# Patient Record
Sex: Female | Born: 1955 | Race: White | Hispanic: No | Marital: Married | Smoking: Never smoker
Health system: Southern US, Community
[De-identification: ages and names within clinical notes are randomized; demographics above are authoritative.]

## PROBLEM LIST (undated history)

## (undated) DIAGNOSIS — R112 Nausea with vomiting, unspecified: Secondary | ICD-10-CM

## (undated) DIAGNOSIS — D649 Anemia, unspecified: Secondary | ICD-10-CM

## (undated) DIAGNOSIS — N200 Calculus of kidney: Secondary | ICD-10-CM

## (undated) DIAGNOSIS — Z9889 Other specified postprocedural states: Secondary | ICD-10-CM

## (undated) DIAGNOSIS — C801 Malignant (primary) neoplasm, unspecified: Secondary | ICD-10-CM

## (undated) DIAGNOSIS — I10 Essential (primary) hypertension: Secondary | ICD-10-CM

## (undated) DIAGNOSIS — E785 Hyperlipidemia, unspecified: Secondary | ICD-10-CM

## (undated) DIAGNOSIS — E119 Type 2 diabetes mellitus without complications: Secondary | ICD-10-CM

## (undated) DIAGNOSIS — K635 Polyp of colon: Secondary | ICD-10-CM

## (undated) HISTORY — DX: Polyp of colon: K63.5

## (undated) HISTORY — PX: MOUTH SURGERY: SHX715

## (undated) HISTORY — PX: DILATION AND CURETTAGE OF UTERUS: SHX78

## (undated) HISTORY — DX: Calculus of kidney: N20.0

## (undated) HISTORY — DX: Essential (primary) hypertension: I10

## (undated) HISTORY — DX: Type 2 diabetes mellitus without complications: E11.9

## (undated) HISTORY — DX: Hyperlipidemia, unspecified: E78.5

## (undated) HISTORY — PX: FOOT SURGERY: SHX648

## (undated) HISTORY — PX: COLONOSCOPY: SHX174

---

## 2004-10-08 DIAGNOSIS — I10 Essential (primary) hypertension: Secondary | ICD-10-CM

## 2004-10-08 DIAGNOSIS — E119 Type 2 diabetes mellitus without complications: Secondary | ICD-10-CM

## 2004-10-08 HISTORY — DX: Type 2 diabetes mellitus without complications: E11.9

## 2004-10-08 HISTORY — DX: Essential (primary) hypertension: I10

## 2006-10-08 DIAGNOSIS — C801 Malignant (primary) neoplasm, unspecified: Secondary | ICD-10-CM

## 2006-10-08 HISTORY — DX: Malignant (primary) neoplasm, unspecified: C80.1

## 2006-10-08 HISTORY — PX: ABDOMINAL HYSTERECTOMY: SHX81

## 2007-06-06 ENCOUNTER — Emergency Department: Payer: Self-pay | Admitting: Emergency Medicine

## 2007-06-12 ENCOUNTER — Ambulatory Visit: Payer: Self-pay | Admitting: Unknown Physician Specialty

## 2007-06-12 ENCOUNTER — Other Ambulatory Visit: Payer: Self-pay

## 2007-06-17 ENCOUNTER — Ambulatory Visit: Payer: Self-pay | Admitting: Gynecologic Oncology

## 2007-07-15 ENCOUNTER — Ambulatory Visit: Payer: Self-pay | Admitting: Gynecologic Oncology

## 2007-08-12 ENCOUNTER — Ambulatory Visit: Payer: Self-pay | Admitting: Gynecologic Oncology

## 2007-09-16 ENCOUNTER — Ambulatory Visit: Payer: Self-pay | Admitting: Family Medicine

## 2008-04-02 ENCOUNTER — Ambulatory Visit: Payer: Self-pay | Admitting: Family Medicine

## 2008-10-08 DIAGNOSIS — K635 Polyp of colon: Secondary | ICD-10-CM

## 2008-10-08 HISTORY — DX: Polyp of colon: K63.5

## 2008-11-25 ENCOUNTER — Ambulatory Visit: Payer: Self-pay | Admitting: Gastroenterology

## 2009-04-28 ENCOUNTER — Ambulatory Visit: Payer: Self-pay | Admitting: Family Medicine

## 2010-05-08 ENCOUNTER — Ambulatory Visit: Payer: Self-pay | Admitting: Family Medicine

## 2011-05-30 ENCOUNTER — Ambulatory Visit: Payer: Self-pay | Admitting: Family Medicine

## 2012-05-30 ENCOUNTER — Ambulatory Visit: Payer: Self-pay | Admitting: Family Medicine

## 2013-06-03 ENCOUNTER — Ambulatory Visit: Payer: Self-pay | Admitting: Family Medicine

## 2014-06-04 ENCOUNTER — Ambulatory Visit: Payer: Self-pay | Admitting: Family Medicine

## 2014-06-10 ENCOUNTER — Ambulatory Visit: Payer: Self-pay | Admitting: Family Medicine

## 2014-06-15 ENCOUNTER — Ambulatory Visit: Payer: Self-pay | Admitting: Family Medicine

## 2014-06-15 HISTORY — PX: BREAST BIOPSY: SHX20

## 2014-06-16 LAB — PATHOLOGY REPORT

## 2015-06-16 ENCOUNTER — Other Ambulatory Visit: Payer: Self-pay | Admitting: Nurse Practitioner

## 2015-06-16 DIAGNOSIS — Z1231 Encounter for screening mammogram for malignant neoplasm of breast: Secondary | ICD-10-CM

## 2015-06-22 ENCOUNTER — Ambulatory Visit
Admission: RE | Admit: 2015-06-22 | Discharge: 2015-06-22 | Disposition: A | Discharge status: Home / Self Care | Payer: No Typology Code available for payment source | Source: Ambulatory Visit | Attending: Nurse Practitioner | Admitting: Nurse Practitioner

## 2015-06-22 DIAGNOSIS — R922 Inconclusive mammogram: Secondary | ICD-10-CM | POA: Insufficient documentation

## 2015-06-22 DIAGNOSIS — Z1231 Encounter for screening mammogram for malignant neoplasm of breast: Secondary | ICD-10-CM | POA: Diagnosis present

## 2015-06-22 HISTORY — DX: Malignant (primary) neoplasm, unspecified: C80.1

## 2015-06-27 ENCOUNTER — Other Ambulatory Visit: Payer: Self-pay | Admitting: Nurse Practitioner

## 2015-06-27 DIAGNOSIS — R928 Other abnormal and inconclusive findings on diagnostic imaging of breast: Secondary | ICD-10-CM

## 2015-06-30 ENCOUNTER — Ambulatory Visit
Admission: RE | Admit: 2015-06-30 | Discharge: 2015-06-30 | Disposition: A | Discharge status: Home / Self Care | Payer: No Typology Code available for payment source | Source: Ambulatory Visit | Attending: Nurse Practitioner | Admitting: Nurse Practitioner

## 2015-06-30 DIAGNOSIS — R928 Other abnormal and inconclusive findings on diagnostic imaging of breast: Secondary | ICD-10-CM

## 2015-06-30 DIAGNOSIS — N63 Unspecified lump in breast: Secondary | ICD-10-CM | POA: Insufficient documentation

## 2015-07-01 ENCOUNTER — Other Ambulatory Visit: Payer: Self-pay | Admitting: Nurse Practitioner

## 2015-07-01 DIAGNOSIS — N63 Unspecified lump in unspecified breast: Secondary | ICD-10-CM

## 2015-07-05 ENCOUNTER — Ambulatory Visit
Admission: RE | Admit: 2015-07-05 | Discharge: 2015-07-05 | Disposition: A | Discharge status: Home / Self Care | Payer: No Typology Code available for payment source | Source: Ambulatory Visit | Attending: Nurse Practitioner | Admitting: Nurse Practitioner

## 2015-07-05 DIAGNOSIS — N63 Unspecified lump in unspecified breast: Secondary | ICD-10-CM

## 2015-07-05 HISTORY — PX: BREAST BIOPSY: SHX20

## 2015-07-06 LAB — SURGICAL PATHOLOGY

## 2015-07-08 ENCOUNTER — Encounter: Payer: Self-pay | Admitting: *Deleted

## 2015-07-14 ENCOUNTER — Ambulatory Visit (INDEPENDENT_AMBULATORY_CARE_PROVIDER_SITE_OTHER): Payer: No Typology Code available for payment source | Admitting: General Surgery

## 2015-07-14 ENCOUNTER — Other Ambulatory Visit: Payer: Self-pay | Admitting: General Surgery

## 2015-07-14 ENCOUNTER — Ambulatory Visit: Payer: No Typology Code available for payment source

## 2015-07-14 ENCOUNTER — Encounter: Payer: Self-pay | Admitting: General Surgery

## 2015-07-14 VITALS — BP 142/80 | HR 64 | Resp 14 | Ht 67.0 in | Wt 292.0 lb

## 2015-07-14 DIAGNOSIS — N6310 Unspecified lump in the right breast, unspecified quadrant: Secondary | ICD-10-CM

## 2015-07-14 DIAGNOSIS — N62 Hypertrophy of breast: Secondary | ICD-10-CM | POA: Diagnosis not present

## 2015-07-14 DIAGNOSIS — N63 Unspecified lump in breast: Secondary | ICD-10-CM

## 2015-07-14 DIAGNOSIS — N6091 Unspecified benign mammary dysplasia of right breast: Secondary | ICD-10-CM

## 2015-07-14 NOTE — Progress Notes (Signed)
Patient ID: Meagan Diaz, female   DOB: 1956/09/25, 59 y.o.   MRN: [id number]  Chief Complaint  Patient presents with  . Breast Problem    right breast lump    HPI Meagan Diaz is a 59 y.o. female.  who presents for a breast evaluation. The most recent mammogram was done on 06-22-15. 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 Patient does perform regular self breast checks and gets regular mammograms done.   Denies any family history of breast or colon cancer.  HPI  Past Medical History  Diagnosis Date  . 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 Hyperlipidemia   . Kidney stone   . Colon polyp 2010  . Diabetes mellitus without complication ([place]) 1103  . Hypertension 2006    Past Surgical History  Procedure Laterality Date  . Foot surgery Bilateral   . Colonoscopy  2010, 2013  . Breast biopsy Left 06/15/14    [place] bx /clip-neg/cyst  . Breast biopsy Right 07-05-15    8:30 location/atypical ductal hyperplasia  . Breast biopsy Right 07-05-15    fibroadenoma  . Abdominal hysterectomy  2008    [facility]    No family history on file.  Social History Social History  Substance Use Topics  . Smoking status: Never Smoker   . Smokeless tobacco: Never Used  . Alcohol Use: No    Allergies  Allergen Reactions  . Clindamycin/Lincomycin Rash  . Penicillins Rash  . Sulfa Antibiotics Rash    Current Outpatient Prescriptions  Medication Sig Dispense Refill  . aspirin 81 MG tablet Take 81 mg by mouth daily.    Marland Kitchen atenolol (TENORMIN) 50 MG tablet Take 50 mg by mouth daily.    Marland Kitchen atorvastatin (LIPITOR) 80 MG tablet Take 80 mg by mouth daily.    . Cholecalciferol (VITAMIN D3) 1000 UNITS CAPS Take by mouth 2 (two) times daily.    . enalapril (VASOTEC) 20 MG tablet Take 20 mg by mouth daily.    Marland Kitchen glipiZIDE (GLUCOTROL XL) 2.5 MG 24 hr tablet Take 2.5 mg by mouth daily with breakfast.    . hydrochlorothiazide (HYDRODIURIL) 12.5 MG tablet Take 12.5 mg by  mouth daily.    . metFORMIN (GLUCOPHAGE) 500 MG tablet Take 500 mg by mouth 2 (two) times daily with a meal.     No current facility-administered medications for this visit.    Review of Systems Review of Systems  Constitutional: Negative.   Respiratory: Negative.   Cardiovascular: Negative.     Blood pressure 142/80, pulse 64, resp. rate 14, height 5\' 7"  (1.702 m), weight 292 lb (132.45 kg).  Physical Exam Physical Exam  Constitutional: She is oriented to person, place, and time. She appears well-developed and well-nourished.  HENT:  Mouth/Throat: Oropharynx is clear and moist.  Eyes: Conjunctivae are normal. No scleral icterus.  Neck: Neck supple.  Cardiovascular: Normal rate, regular rhythm and normal heart sounds.   Pulmonary/Chest: Effort normal and breath sounds normal. Right breast exhibits no inverted nipple, no mass, no nipple discharge, no skin change and no tenderness. Left breast exhibits no inverted nipple, no mass, no nipple discharge, no skin change and no tenderness.  Lymphadenopathy:    She has no cervical adenopathy.    She has no axillary adenopathy.  Neurological: She is alert and oriented to person, place, and time.  Skin: Skin is warm and dry.  Psychiatric: Her behavior is normal.    Data Reviewed  September 2016 mammogram and ultrasound reports reviewed.  Pathology from biopsy completed on 07/05/2015 of the right breast showed a fibroadenoma with focal ductal hyperplasia at the 9:00 position as well as atypical ductal hyperplasia with apical and cysts and dilated ducts with surrounding sclerosis at the 8:30 o'clock position. The area of atypical hyperplasia was 0.5 mm in diameter.  Postbiopsy images were reviewed.  Limited right breast ultrasound was undertaken to determine if the biopsy sites were clearly identified. At the 9:00 position a multilobulated 0.4 x 0.6 x 0.8 cm area consistent with the biopsy site is noted 9 cm from the nipple.  At the 8:30  o'clock position a vaguely defined less than 0.6 cm area with posterior acoustic shadowing was noted. The biopsy clip was not clearly identified.  Assessment    Microscopic foci of ADH for which reexcision is indicated to confirm the areas not upstaged to DCIS.    Plan    The visit got off to somewhat of a stormy start as the patient was dismayed that I had not reviewed her images prior to coming in to meet her. I explained to her the importance of meeting the person first and the images are not reviewed until the patient presents for the appointment.   The indication for formal excision of the area was reviewed. There is approximately 15% chance of upstaging to DCIS.  Patient numerous questions which were addressed. Indications for chemoprevention will be deferred pending review of the final resected specimen.      Patient's surgery has been scheduled for 07-21-15 at [facility]. It is okay for patient to continue 81 mg aspirin once daily.   PCP:  Altamease Oiler 07/14/2015, 5:13 PM

## 2015-07-14 NOTE — Patient Instructions (Addendum)
The patient is aware to call back for any questions or concerns.  Schedule surgery right breast.   Patient's surgery has been scheduled for 07-21-15 at [facility]. It is okay for patient to continue 81 mg aspirin once daily.

## 2015-07-14 NOTE — H&P (Signed)
HPI  Meagan Diaz is a 59 y.o. female. who presents for a breast evaluation. The most recent mammogram was done on 06-22-15. 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  Patient does perform regular self breast checks and gets regular mammograms done.  Denies any family history of breast or colon cancer.  HPI  Past Medical History   Diagnosis  Date   .  Cancer [facility])  2008     uterine ca   .  Hyperlipidemia    .  Kidney stone    .  Colon polyp  2010   .  Diabetes mellitus without complication ([place])  3825   .  Hypertension  2006    Past Surgical History   Procedure  Laterality  Date   .  Foot surgery  Bilateral    .  Colonoscopy   2010, 2013   .  Breast biopsy  Left  06/15/14     [place] bx /clip-neg/cyst   .  Breast biopsy  Right  07-05-15     8:30 location/atypical ductal hyperplasia   .  Breast biopsy  Right  07-05-15     fibroadenoma   .  Abdominal hysterectomy   2008     [facility]    No family history on file.  Social History  Social History   Substance Use Topics   .  Smoking status:  Never Smoker   .  Smokeless tobacco:  Never Used   .  Alcohol Use:  No    Allergies   Allergen  Reactions   .  Clindamycin/Lincomycin  Rash   .  Penicillins  Rash   .  Sulfa Antibiotics  Rash    Current Outpatient Prescriptions   Medication  Sig  Dispense  Refill   .  aspirin 81 MG tablet  Take 81 mg by mouth daily.     Marland Kitchen  atenolol (TENORMIN) 50 MG tablet  Take 50 mg by mouth daily.     Marland Kitchen  atorvastatin (LIPITOR) 80 MG tablet  Take 80 mg by mouth daily.     .  Cholecalciferol (VITAMIN D3) 1000 UNITS CAPS  Take by mouth 2 (two) times daily.     .  enalapril (VASOTEC) 20 MG tablet  Take 20 mg by mouth daily.     Marland Kitchen  glipiZIDE (GLUCOTROL XL) 2.5 MG 24 hr tablet  Take 2.5 mg by mouth daily with breakfast.     .  hydrochlorothiazide (HYDRODIURIL) 12.5 MG tablet  Take 12.5 mg by mouth daily.     .  metFORMIN (GLUCOPHAGE) 500 MG tablet  Take 500 mg by mouth  2 (two) times daily with a meal.      No current facility-administered medications for this visit.    Review of Systems  Review of Systems  Constitutional: Negative.  Respiratory: Negative.  Cardiovascular: Negative.   Blood pressure 142/80, pulse 64, resp. rate 14, height 5\' 7"  (1.702 m), weight 292 lb (132.45 kg).  Physical Exam  Physical Exam  Constitutional: She is oriented to person, place, and time. She appears well-developed and well-nourished.  HENT:  Mouth/Throat: Oropharynx is clear and moist.  Eyes: Conjunctivae are normal. No scleral icterus.  Neck: Neck supple.  Cardiovascular: Normal rate, regular rhythm and normal heart sounds.  Pulmonary/Chest: Effort normal and breath sounds normal. Right breast exhibits no inverted nipple, no mass, no nipple discharge, no skin change and no tenderness. Left breast exhibits no inverted nipple, no  mass, no nipple discharge, no skin change and no tenderness.  Lymphadenopathy:  She has no cervical adenopathy.  She has no axillary adenopathy.  Neurological: She is alert and oriented to person, place, and time.  Skin: Skin is warm and dry.  Psychiatric: Her behavior is normal.   Data Reviewed  September 2016 mammogram and ultrasound reports reviewed.  Pathology from biopsy completed on 07/05/2015 of the right breast showed a fibroadenoma with focal ductal hyperplasia at the 9:00 position as well as atypical ductal hyperplasia with apical and cysts and dilated ducts with surrounding sclerosis at the 8:30 o'clock position. The area of atypical hyperplasia was 0.5 mm in diameter.  Postbiopsy images were reviewed.  Limited right breast ultrasound was undertaken to determine if the biopsy sites were clearly identified. At the 9:00 position a multilobulated 0.4 x 0.6 x 0.8 cm area consistent with the biopsy site is noted 9 cm from the nipple.  At the 8:30 o'clock position a vaguely defined less than 0.6 cm area with posterior acoustic shadowing  was noted. The biopsy clip was not clearly identified.  Assessment   Microscopic foci of ADH for which reexcision is indicated to confirm the areas not upstaged to DCIS.  Plan   The visit got off to somewhat of a stormy start as the patient was dismayed that I had not reviewed her images prior to coming in to meet her. I explained to her the importance of meeting the person first and the images are not reviewed until the patient presents for the appointment.  The indication for formal excision of the area was reviewed. There is approximately 15% chance of upstaging to DCIS.  Patient numerous questions which were addressed. Indications for chemoprevention will be deferred pending review of the final resected specimen.   Patient's surgery has been scheduled for 07-21-15 at [facility]. It is okay for patient to continue 81 mg aspirin once daily.  PCP: Altamease Oiler  07/14/2015, 5:13 PM

## 2015-07-15 ENCOUNTER — Inpatient Hospital Stay: Admission: RE | Admit: 2015-07-15 | Payer: No Typology Code available for payment source | Source: Ambulatory Visit

## 2015-07-15 ENCOUNTER — Encounter: Payer: Self-pay | Admitting: *Deleted

## 2015-07-15 NOTE — Patient Instructions (Addendum)
  Your procedure is scheduled on: 07-21-15 Report to [facility] @ 9:20 PER PT   Remember: Instructions that are not followed completely may result in serious medical risk, up to and including death, or upon the discretion of your surgeon and anesthesiologist your surgery may need to be rescheduled.    __X_ 1. Do not eat food or drink liquids after midnight. No gum chewing or hard candies.     __X_ 2. No Alcohol for 24 hours before or after surgery.   ____ 3. Bring all medications with you on the day of surgery if instructed.    ____ 4. Notify your doctor if there is any change in your medical condition     (cold, fever, infections).     Do not wear jewelry, make-up, hairpins, clips or nail polish.  Do not wear lotions, powders, or perfumes. You may wear deodorant.  Do not shave 48 hours prior to surgery. Men may shave face and neck.  Do not bring valuables to the hospital.    [facility] is not responsible for any belongings or valuables.               Contacts, dentures or bridgework may not be worn into surgery.  Leave your suitcase in the car. After surgery it may be brought to your room.  For patients admitted to the hospital, discharge time is determined by your treatment team.   Patients discharged the day of surgery will not be allowed to drive home.   Please read over the following fact sheets that you were given:      __X_ Take these medicines the morning of surgery with A SIP OF WATER:    1. ATENOLOL  2. ENALAPRIL  3.   4.  5.  6.  ____ Fleet Enema (as directed)   ____ Use CHG Soap as directed  ____ Use inhalers on the day of surgery  __X__ Stop metformin 2 days prior to surgery-LAST DOSE ON Monday 07-18-15    ____ Take 1/2 of usual insulin dose the night before surgery and none on the morning of surgery.   ____ Stop Coumadin/Plavix/aspirin-MAY CONTINUE 81 MG ASA PER DR BYRNETTS H&P-DO NOT TAKE AM OF SURGERY  ____ Stop  Anti-inflammatories   ____ Stop supplements until after surgery.    ____ Bring C-Pap to the hospital.

## 2015-07-18 ENCOUNTER — Ambulatory Visit
Admission: RE | Admit: 2015-07-18 | Discharge: 2015-07-18 | Disposition: A | Discharge status: Home / Self Care | Payer: No Typology Code available for payment source | Source: Ambulatory Visit | Attending: General Surgery | Admitting: General Surgery

## 2015-07-18 ENCOUNTER — Other Ambulatory Visit
Admission: RE | Admit: 2015-07-18 | Discharge: 2015-07-18 | Disposition: A | Discharge status: Home / Self Care | Payer: No Typology Code available for payment source | Source: Ambulatory Visit | Attending: General Surgery | Admitting: General Surgery

## 2015-07-18 DIAGNOSIS — I10 Essential (primary) hypertension: Secondary | ICD-10-CM | POA: Diagnosis present

## 2015-07-18 DIAGNOSIS — Z01812 Encounter for preprocedural laboratory examination: Secondary | ICD-10-CM | POA: Insufficient documentation

## 2015-07-18 DIAGNOSIS — Z01810 Encounter for preprocedural cardiovascular examination: Secondary | ICD-10-CM | POA: Diagnosis present

## 2015-07-18 LAB — POTASSIUM: Potassium: 4.1 mmol/L (ref 3.5–5.1)

## 2015-07-21 ENCOUNTER — Ambulatory Visit
Admission: RE | Admit: 2015-07-21 | Discharge: 2015-07-21 | Disposition: A | Discharge status: Home / Self Care | Payer: No Typology Code available for payment source | Source: Ambulatory Visit | Attending: General Surgery | Admitting: General Surgery

## 2015-07-21 ENCOUNTER — Encounter
Admission: RE | Disposition: A | Discharge status: Home / Self Care | Payer: Self-pay | Source: Ambulatory Visit | Attending: General Surgery

## 2015-07-21 ENCOUNTER — Encounter: Payer: Self-pay | Admitting: *Deleted

## 2015-07-21 ENCOUNTER — Ambulatory Visit: Payer: No Typology Code available for payment source | Admitting: Registered Nurse

## 2015-07-21 ENCOUNTER — Ambulatory Visit: Payer: No Typology Code available for payment source

## 2015-07-21 DIAGNOSIS — N6310 Unspecified lump in the right breast, unspecified quadrant: Secondary | ICD-10-CM

## 2015-07-21 DIAGNOSIS — Z880 Allergy status to penicillin: Secondary | ICD-10-CM | POA: Diagnosis not present

## 2015-07-21 DIAGNOSIS — Z87442 Personal history of urinary calculi: Secondary | ICD-10-CM | POA: Insufficient documentation

## 2015-07-21 DIAGNOSIS — Z882 Allergy status to sulfonamides status: Secondary | ICD-10-CM | POA: Diagnosis not present

## 2015-07-21 DIAGNOSIS — N6091 Unspecified benign mammary dysplasia of right breast: Secondary | ICD-10-CM | POA: Insufficient documentation

## 2015-07-21 DIAGNOSIS — Z881 Allergy status to other antibiotic agents status: Secondary | ICD-10-CM | POA: Diagnosis not present

## 2015-07-21 DIAGNOSIS — D649 Anemia, unspecified: Secondary | ICD-10-CM | POA: Diagnosis not present

## 2015-07-21 DIAGNOSIS — Z79899 Other long term (current) drug therapy: Secondary | ICD-10-CM | POA: Diagnosis not present

## 2015-07-21 DIAGNOSIS — E559 Vitamin D deficiency, unspecified: Secondary | ICD-10-CM | POA: Diagnosis not present

## 2015-07-21 DIAGNOSIS — E785 Hyperlipidemia, unspecified: Secondary | ICD-10-CM | POA: Insufficient documentation

## 2015-07-21 DIAGNOSIS — Z8249 Family history of ischemic heart disease and other diseases of the circulatory system: Secondary | ICD-10-CM | POA: Diagnosis not present

## 2015-07-21 DIAGNOSIS — E119 Type 2 diabetes mellitus without complications: Secondary | ICD-10-CM | POA: Diagnosis not present

## 2015-07-21 DIAGNOSIS — Z6841 Body Mass Index (BMI) 40.0 and over, adult: Secondary | ICD-10-CM | POA: Diagnosis not present

## 2015-07-21 DIAGNOSIS — Z7984 Long term (current) use of oral hypoglycemic drugs: Secondary | ICD-10-CM | POA: Diagnosis not present

## 2015-07-21 DIAGNOSIS — D4861 Neoplasm of uncertain behavior of right breast: Secondary | ICD-10-CM | POA: Diagnosis not present

## 2015-07-21 DIAGNOSIS — R928 Other abnormal and inconclusive findings on diagnostic imaging of breast: Secondary | ICD-10-CM | POA: Diagnosis present

## 2015-07-21 DIAGNOSIS — I10 Essential (primary) hypertension: Secondary | ICD-10-CM | POA: Diagnosis not present

## 2015-07-21 HISTORY — DX: Nausea with vomiting, unspecified: R11.2

## 2015-07-21 HISTORY — DX: Other specified postprocedural states: Z98.890

## 2015-07-21 HISTORY — PX: BREAST BIOPSY: SHX20

## 2015-07-21 HISTORY — DX: Anemia, unspecified: D64.9

## 2015-07-21 LAB — GLUCOSE, CAPILLARY: Glucose-Capillary: 126 mg/dL — ABNORMAL HIGH (ref 65–99)

## 2015-07-21 SURGERY — BREAST BIOPSY WITH NEEDLE LOCALIZATION
Anesthesia: Monitor Anesthesia Care | Laterality: Right | Wound class: Clean

## 2015-07-21 MED ORDER — FENTANYL CITRATE (PF) 100 MCG/2ML IJ SOLN: 25.0000 ug | INTRAMUSCULAR | Status: DC | PRN

## 2015-07-21 MED ORDER — MIDAZOLAM HCL 2 MG/2ML IJ SOLN
INTRAMUSCULAR | Status: DC | PRN
  Administered 2015-07-21: 2 mg via INTRAVENOUS

## 2015-07-21 MED ORDER — ACETAMINOPHEN 10 MG/ML IV SOLN
INTRAVENOUS | Status: DC | PRN
  Administered 2015-07-21: 1000 mg via INTRAVENOUS

## 2015-07-21 MED ORDER — SODIUM CHLORIDE 0.9 % IV SOLN
INTRAVENOUS | Status: DC
  Administered 2015-07-21: 11:00:00 via INTRAVENOUS

## 2015-07-21 MED ORDER — HYDROCODONE-ACETAMINOPHEN 5-325 MG PO TABS: 1.0000 | ORAL_TABLET | ORAL | Status: DC | PRN

## 2015-07-21 MED ORDER — BUPIVACAINE-EPINEPHRINE (PF) 0.5% -1:200000 IJ SOLN
INTRAMUSCULAR | Status: AC
  Filled 2015-07-21: qty 30

## 2015-07-21 MED ORDER — FAMOTIDINE 20 MG PO TABS
ORAL_TABLET | ORAL | Status: AC
  Administered 2015-07-21: 20 mg via ORAL
  Filled 2015-07-21: qty 1

## 2015-07-21 MED ORDER — ONDANSETRON HCL 4 MG/2ML IJ SOLN: 4.0000 mg | Freq: Once | INTRAMUSCULAR | Status: DC | PRN

## 2015-07-21 MED ORDER — EPHEDRINE SULFATE 50 MG/ML IJ SOLN
INTRAMUSCULAR | Status: DC | PRN
  Administered 2015-07-21 (×5): 10 mg via INTRAVENOUS

## 2015-07-21 MED ORDER — BUPIVACAINE-EPINEPHRINE (PF) 0.5% -1:200000 IJ SOLN
INTRAMUSCULAR | Status: DC | PRN
  Administered 2015-07-21: 30 mL

## 2015-07-21 MED ORDER — KETOROLAC TROMETHAMINE 30 MG/ML IJ SOLN
INTRAMUSCULAR | Status: DC | PRN
  Administered 2015-07-21: 30 mg via INTRAVENOUS

## 2015-07-21 MED ORDER — ONDANSETRON HCL 4 MG/2ML IJ SOLN
INTRAMUSCULAR | Status: DC | PRN
  Administered 2015-07-21: 4 mg via INTRAVENOUS

## 2015-07-21 MED ORDER — LIDOCAINE HCL 2 % EX GEL
CUTANEOUS | Status: DC | PRN
  Administered 2015-07-21: 1 via TOPICAL

## 2015-07-21 MED ORDER — PROPOFOL 10 MG/ML IV BOLUS
INTRAVENOUS | Status: DC | PRN
  Administered 2015-07-21: 180 mg via INTRAVENOUS

## 2015-07-21 MED ORDER — LIDOCAINE HCL (CARDIAC) 20 MG/ML IV SOLN
INTRAVENOUS | Status: DC | PRN
  Administered 2015-07-21: 100 mg via INTRAVENOUS

## 2015-07-21 MED ORDER — FAMOTIDINE 20 MG PO TABS
20.0000 mg | ORAL_TABLET | Freq: Once | ORAL | Status: AC
  Administered 2015-07-21: 20 mg via ORAL

## 2015-07-21 MED ORDER — ACETAMINOPHEN 10 MG/ML IV SOLN
INTRAVENOUS | Status: AC
  Filled 2015-07-21: qty 100

## 2015-07-21 MED ORDER — FENTANYL CITRATE (PF) 100 MCG/2ML IJ SOLN
INTRAMUSCULAR | Status: DC | PRN
  Administered 2015-07-21: 50 ug via INTRAVENOUS

## 2015-07-21 MED ORDER — DEXAMETHASONE SODIUM PHOSPHATE 4 MG/ML IJ SOLN
INTRAMUSCULAR | Status: DC | PRN
  Administered 2015-07-21: 10 mg via INTRAVENOUS

## 2015-07-21 MED ORDER — GLYCOPYRROLATE 0.2 MG/ML IJ SOLN
INTRAMUSCULAR | Status: DC | PRN
  Administered 2015-07-21: 0.2 mg via INTRAVENOUS

## 2015-07-21 SURGICAL SUPPLY — 37 items
BANDAGE ELASTIC 6 CLIP NS LF (GAUZE/BANDAGES/DRESSINGS) ×3 IMPLANT
BLADE SURG 15 STRL SS SAFETY (BLADE) ×6 IMPLANT
BNDG GAUZE 4.5X4.1 6PLY STRL (MISCELLANEOUS) ×3 IMPLANT
CANISTER SUCT 1200ML W/VALVE (MISCELLANEOUS) ×3 IMPLANT
CHLORAPREP W/TINT 26ML (MISCELLANEOUS) ×3 IMPLANT
CLOSURE WOUND 1/2 X4 (GAUZE/BANDAGES/DRESSINGS) ×1
CNTNR SPEC 2.5X3XGRAD LEK (MISCELLANEOUS) ×1
CONT SPEC 4OZ STER OR WHT (MISCELLANEOUS) ×2
CONTAINER SPEC 2.5X3XGRAD LEK (MISCELLANEOUS) ×1 IMPLANT
COVER PROBE FLX POLY STRL (MISCELLANEOUS) ×3 IMPLANT
DEVICE DUBIN SPECIMEN MAMMOGRA (MISCELLANEOUS) ×3 IMPLANT
DRAPE LAPAROTOMY 100X77 ABD (DRAPES) ×3 IMPLANT
DRESSING TELFA 4X3 1S ST N-ADH (GAUZE/BANDAGES/DRESSINGS) ×3 IMPLANT
ELECT CAUTERY BLADE TIP 2.5 (TIP) ×3
ELECTRODE CAUTERY BLDE TIP 2.5 (TIP) ×1 IMPLANT
GAUZE FLUFF 18X24 1PLY STRL (GAUZE/BANDAGES/DRESSINGS) ×3 IMPLANT
GLOVE BIO SURGEON STRL SZ7.5 (GLOVE) ×3 IMPLANT
GLOVE INDICATOR 8.0 STRL GRN (GLOVE) ×3 IMPLANT
GOWN STRL REUS W/ TWL LRG LVL3 (GOWN DISPOSABLE) ×2 IMPLANT
GOWN STRL REUS W/TWL LRG LVL3 (GOWN DISPOSABLE) ×4
KIT RM TURNOVER STRD PROC AR (KITS) ×3 IMPLANT
LABEL OR SOLS (LABEL) ×3 IMPLANT
MARGIN MAP 10MM (MISCELLANEOUS) ×3 IMPLANT
NDL SAFETY 22GX1.5 (NEEDLE) ×3 IMPLANT
NEEDLE HYPO 25X1 1.5 SAFETY (NEEDLE) ×3 IMPLANT
PACK BASIN MINOR ARMC (MISCELLANEOUS) ×3 IMPLANT
PAD GROUND ADULT SPLIT (MISCELLANEOUS) ×3 IMPLANT
STRIP CLOSURE SKIN 1/2X4 (GAUZE/BANDAGES/DRESSINGS) ×2 IMPLANT
SUT ETHILON 3-0 FS-10 30 BLK (SUTURE) ×3
SUT VIC AB 2-0 CT1 27 (SUTURE) ×2
SUT VIC AB 2-0 CT1 TAPERPNT 27 (SUTURE) ×1 IMPLANT
SUT VIC AB 4-0 FS2 27 (SUTURE) ×3 IMPLANT
SUTURE EHLN 3-0 FS-10 30 BLK (SUTURE) ×1 IMPLANT
SWABSTK COMLB BENZOIN TINCTURE (MISCELLANEOUS) ×3 IMPLANT
SYR CONTROL 10ML (SYRINGE) ×3 IMPLANT
TAPE TRANSPORE STRL 2 31045 (GAUZE/BANDAGES/DRESSINGS) IMPLANT
WATER STERILE IRR 1000ML POUR (IV SOLUTION) ×3 IMPLANT

## 2015-07-21 NOTE — H&P (Signed)
No change in clinical history or exam. For needle loc biopsy today.

## 2015-07-21 NOTE — Op Note (Signed)
Preoperative diagnosis: ADH right breast, lower outer quadrant.  Postoperative diagnosis: Same.  Operative procedure: Wire localization and wide excision right breast ADH.  Operative surgeon: Celesta Gentile, M.D.  Anesthesia: Gen. by LMA, Marcaine 0.5% with 1-200,000 units of epinephrine, 30 mL.  Estimated blood loss: Less than 5 mL.  Clinical note this 59 year old woman recently had an abnormal mammogram and stereotactic biopsy showed areas of ADH. She was felt to be candidate for reexcision to confirm there is no upstaging to DCIS or invasive mammary carcinoma.  Operative note the patient underwent needle localization prior the procedure. After the induction of general anesthesia the breast was carefully prepped with ChloraPrep and draped. The breast was taped medially to better expose the inferior aspect. Marcaine was infiltrated for postoperative analgesia. A radial incision at the 8:00 position was made along the anticipated course of the localizing wire based on ultrasound intraoperatively. The skin was incised sharply and the remaining dissection completed with electrocautery. A 2 x 3 x 6 cm block of tissue was removed, orientated and sent for specimen radiograph. It showed the complete wire tip as well as the previously placed localizing clip. Grossly there was no abnormality in the tissue. The breast tissue superiorly was mobilized to allow approximation inferiorly to prevent dimpling. This was completed with a running layer of 2-0 Vicryl figure-of-eight sutures followed by a running layer of 2-0 Vicryl sutures. The skin was closed with a running 4-0 Vicryl septic suture. Benzoin and Steri-Strips followed by Telfa fluff gauze and the patient's bra were then applied.  The patient tolerated the procedure well and was taken to the recovery room in stable condition.

## 2015-07-21 NOTE — Transfer of Care (Signed)
Immediate Anesthesia Transfer of Care Note  Patient: Meagan Diaz  Procedure(s) Performed: Procedure(s): BREAST BIOPSY WITH NEEDLE LOCALIZATION (Right)  Patient Location: PACU  Anesthesia Type:General  Level of Consciousness: sedated  Airway & Oxygen Therapy: Patient Spontanous Breathing and Patient connected to face mask oxygen  Post-op Assessment: Report given to RN and Post -op Vital signs reviewed and stable  Post vital signs: Reviewed and stable  Last Vitals:  Filed Vitals:   07/21/15 1230  BP: 121/60  Pulse: 67  Temp:   Resp: 16    Complications: No apparent anesthesia complications

## 2015-07-21 NOTE — Discharge Instructions (Signed)
AMBULATORY SURGERY  °DISCHARGE INSTRUCTIONS ° ° °1) The drugs that you were given will stay in your system until tomorrow so for the next 24 hours you should not: ° °A) Drive an automobile °B) Make any legal decisions °C) Drink any alcoholic beverage ° ° °2) You may resume regular meals tomorrow.  Today it is better to start with liquids and gradually work up to solid foods. ° °You may eat anything you prefer, but it is better to start with liquids, then soup and crackers, and gradually work up to solid foods. ° ° °3) Please notify your doctor immediately if you have any unusual bleeding, trouble breathing, redness and pain at the surgery site, drainage, fever, or pain not relieved by medication. ° ° ° °4) Additional Instructions: ° ° ° ° ° ° ° °Please contact your physician with any problems or Same Day Surgery at [phone], Monday through Friday 6 am to 4 pm, or [place] [facility] [place] Main number at [phone]. °

## 2015-07-21 NOTE — Anesthesia Procedure Notes (Signed)
Procedure Name: LMA Insertion Date/Time: 07/21/2015 11:30 AM Performed by: Doreen Salvage Pre-anesthesia Checklist: Patient identified, Patient being monitored, Timeout performed, Emergency Drugs available and Suction available Patient Re-evaluated:Patient Re-evaluated prior to inductionOxygen Delivery Method: Circle system utilized Preoxygenation: Pre-oxygenation with 100% oxygen Intubation Type: IV induction Ventilation: Mask ventilation without difficulty LMA: LMA inserted LMA Size: 5.0 Tube type: Oral Number of attempts: 1 Placement Confirmation: positive ETCO2 and breath sounds checked- equal and bilateral Tube secured with: Tape Dental Injury: Teeth and Oropharynx as per pre-operative assessment

## 2015-07-21 NOTE — Anesthesia Postprocedure Evaluation (Signed)
  Anesthesia Post-op Note  Patient: Meagan Diaz  Procedure(s) Performed: Procedure(s): BREAST BIOPSY WITH NEEDLE LOCALIZATION (Right)  Anesthesia type:General, MAC  Patient location: PACU  Post pain: Pain level controlled  Post assessment: Post-op Vital signs reviewed, Patient's Cardiovascular Status Stable, Respiratory Function Stable, Patent Airway and No signs of Nausea or vomiting  Post vital signs: Reviewed and stable  Last Vitals:  Filed Vitals:   07/21/15 1324  BP: 117/57  Pulse: 69  Temp: 35.7 C  Resp: 14    Level of consciousness: awake, alert  and patient cooperative  Complications: No apparent anesthesia complications

## 2015-07-21 NOTE — Anesthesia Preprocedure Evaluation (Addendum)
Anesthesia Evaluation  Patient identified by MRN, date of birth, ID band Patient awake    Reviewed: Allergy & Precautions, NPO status , Patient's Chart, lab work & pertinent test results  History of Anesthesia Complications (+) PONV  Airway Mallampati: IV  TM Distance: <3 FB Neck ROM: Limited    Dental  (+) Chipped, Caps   Pulmonary neg pulmonary ROS,    Pulmonary exam normal breath sounds clear to auscultation       Cardiovascular hypertension, Pt. on medications Normal cardiovascular exam     Neuro/Psych negative neurological ROS  negative psych ROS   GI/Hepatic negative GI ROS, Neg liver ROS,   Endo/Other  diabetes  Renal/GU Kidney stone     Musculoskeletal negative musculoskeletal ROS (+)   Abdominal Normal abdominal exam  (+) + obese,   Peds negative pediatric ROS (+)  Hematology  (+) anemia ,   Anesthesia Other Findings Morbid obesity  Reproductive/Obstetrics negative OB ROS                            Anesthesia Physical Anesthesia Plan  ASA: III  Anesthesia Plan: General   Post-op Pain Management:    Induction: Intravenous  Airway Management Planned: LMA  Additional Equipment:   Intra-op Plan:   Post-operative Plan:   Informed Consent: I have reviewed the patients History and Physical, chart, labs and discussed the procedure including the risks, benefits and alternatives for the proposed anesthesia with the patient or authorized representative who has indicated his/her understanding and acceptance.   Dental advisory given  Plan Discussed with: CRNA and Surgeon  Anesthesia Plan Comments:        Anesthesia Quick Evaluation

## 2015-07-22 LAB — SURGICAL PATHOLOGY

## 2015-07-23 ENCOUNTER — Telehealth: Payer: Self-pay | Admitting: General Surgery

## 2015-07-23 NOTE — Telephone Encounter (Signed)
Notified no upstaging on pathology. Will review options for chemoprevention at office follow up.

## 2015-07-28 ENCOUNTER — Ambulatory Visit (INDEPENDENT_AMBULATORY_CARE_PROVIDER_SITE_OTHER): Payer: No Typology Code available for payment source | Admitting: *Deleted

## 2015-07-28 DIAGNOSIS — N6091 Unspecified benign mammary dysplasia of right breast: Secondary | ICD-10-CM

## 2015-07-28 DIAGNOSIS — N62 Hypertrophy of breast: Secondary | ICD-10-CM

## 2015-07-28 NOTE — Progress Notes (Signed)
Patient came in today for a wound check right lumpectomy site  The wound is clean, with no signs of infection noted. Follow up as scheduled. The patient is aware to use a heating pad as needed for comfort.

## 2015-08-08 ENCOUNTER — Ambulatory Visit (INDEPENDENT_AMBULATORY_CARE_PROVIDER_SITE_OTHER): Payer: No Typology Code available for payment source | Admitting: General Surgery

## 2015-08-08 VITALS — BP 132/78 | HR 82 | Resp 14 | Ht 67.0 in | Wt 294.0 lb

## 2015-08-08 DIAGNOSIS — N62 Hypertrophy of breast: Secondary | ICD-10-CM

## 2015-08-08 DIAGNOSIS — N6091 Unspecified benign mammary dysplasia of right breast: Secondary | ICD-10-CM

## 2015-08-08 MED ORDER — TAMOXIFEN CITRATE 20 MG PO TABS: 20.0000 mg | ORAL_TABLET | Freq: Every day | ORAL | Status: DC

## 2015-08-08 NOTE — Progress Notes (Signed)
Patient ID: GWENNA FUSTON, female   DOB: 1956/04/19, 59 y.o.   MRN: [id number]  Chief Complaint  Patient presents with  . Routine Post Op    right lumpectomy    HPI DERIAN PFOST is a 59 y.o. female here today for her post op right lumpectomy done on 07/21/15. Patient states she is doing well.  HPI  Past Medical History  Diagnosis Date  . 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 Hyperlipidemia   . Kidney stone   . Colon polyp 2010  . Diabetes mellitus without complication ([place]) 2992  . Hypertension 2006  . Anemia     H/O  . PONV (postoperative nausea and vomiting)     Past Surgical History  Procedure Laterality Date  . Foot surgery Bilateral   . Colonoscopy  2010, 2013  . Breast biopsy Left 06/15/14    [place] bx /clip-neg/cyst  . Breast biopsy Right 07-05-15    8:30 location/atypical ductal hyperplasia  . Breast biopsy Right 07-05-15    fibroadenoma  . Abdominal hysterectomy  2008    [facility]  . Dilation and curettage of uterus    . Mouth surgery      DENTAL IMPLANTS  . Breast biopsy Right 07/21/2015    Procedure: BREAST BIOPSY WITH NEEDLE LOCALIZATION;  Surgeon: Robert Bellow, MD;  Location: [facility] ORS;  Service: General;  Laterality: Right;    Family History  Problem Relation Age of Onset  . Breast cancer Neg Hx     Social History Social History  Substance Use Topics  . Smoking status: Never Smoker   . Smokeless tobacco: Never Used  . Alcohol Use: No    Allergies  Allergen Reactions  . Clindamycin/Lincomycin Rash  . Penicillins Rash  . Sulfa Antibiotics Rash    Current Outpatient Prescriptions  Medication Sig Dispense Refill  . aspirin 81 MG tablet Take 81 mg by mouth daily.    Marland Kitchen atenolol (TENORMIN) 50 MG tablet Take 50 mg by mouth every morning.     Marland Kitchen atorvastatin (LIPITOR) 80 MG tablet Take 80 mg by mouth daily at 6 PM.     . Cholecalciferol (VITAMIN D3) 1000 UNITS CAPS Take by mouth 2 (two) times daily.    . enalapril (VASOTEC) 20 MG tablet Take 20  mg by mouth every morning.     Marland Kitchen glipiZIDE (GLUCOTROL XL) 2.5 MG 24 hr tablet Take 2.5 mg by mouth daily with breakfast.    . hydrochlorothiazide (HYDRODIURIL) 12.5 MG tablet Take 12.5 mg by mouth daily.    Marland Kitchen HYDROcodone-acetaminophen (NORCO) 5-325 MG tablet Take 1-2 tablets by mouth every 4 (four) hours as needed. 30 tablet 0  . metFORMIN (GLUCOPHAGE) 500 MG tablet Take 500 mg by mouth 2 (two) times daily with a meal.    . tamoxifen (NOLVADEX) 20 MG tablet Take 1 tablet (20 mg total) by mouth daily. 30 tablet 11   No current facility-administered medications for this visit.    Review of Systems Review of Systems  Constitutional: Negative.   Respiratory: Negative.   Cardiovascular: Negative.     Blood pressure 132/78, pulse 82, resp. rate 14, height 5\' 7"  (1.702 m), weight 294 lb (133.358 kg).  Physical Exam Physical Exam  Constitutional: She is oriented to person, place, and time. She appears well-developed and well-nourished.  Pulmonary/Chest:    Right breast incision looks clean and healing well.   Neurological: She is alert and oriented to person, place, and time.  Skin:  Skin is warm and dry.    Data Reviewed Pathology showed only ADH. No upstaging. Margins clear.  Baker Janus Model risk assessment: 3.7% 5 years, 17.7% lifetime risk of breast cancer.  Assessment    Doing well status post formal excision of ADH.    Plan    Opportunity for chemoprevention reviewed with anticipated 50% reduction in ipsilateral and contralateral breast cancer. Risks of tamoxifen therapy reviewed: 1) sees DVT (similar to when she made use of oral contraceptives) and 2) vasomotor symptoms.  She was advised that symptoms may occur weeks 2 and 3 after initiation of therapy would typically resolved by weeks 4. She is making use of the pediatric aspirin and this may lower her risk of DVT.  She'll make use of the medication for a month and we'll assess her symptoms and tolerance of the medication at  that time      PCP:  Mercy Riding 08/08/2015, 1:12 PM

## 2015-09-07 ENCOUNTER — Ambulatory Visit (INDEPENDENT_AMBULATORY_CARE_PROVIDER_SITE_OTHER): Payer: No Typology Code available for payment source | Admitting: General Surgery

## 2015-09-07 ENCOUNTER — Encounter: Payer: Self-pay | Admitting: General Surgery

## 2015-09-07 VITALS — BP 138/82 | HR 76 | Resp 14 | Ht 67.0 in | Wt 292.0 lb

## 2015-09-07 DIAGNOSIS — N6091 Unspecified benign mammary dysplasia of right breast: Secondary | ICD-10-CM

## 2015-09-07 DIAGNOSIS — N62 Hypertrophy of breast: Secondary | ICD-10-CM

## 2015-09-07 NOTE — Progress Notes (Signed)
Patient ID: Meagan Diaz, female   DOB: 1955/12/19, 59 y.o.   MRN: [id number]  Chief Complaint  Patient presents with  . Follow-up    right breast lumpectomy    HPI Meagan Diaz is a 59 y.o. female here today for her followup from a right breast lumpectomy done on 07/21/15. Patient states she is doing well. Doing well on her tamoxifen.  HPI  Past Medical History  Diagnosis Date  . Cancer [facility]) 2008    uterine ca  . Hyperlipidemia   . Kidney stone   . Colon polyp 2010  . Diabetes mellitus without complication ([place]) 123456  . Hypertension 2006  . Anemia     H/O  . PONV (postoperative nausea and vomiting)     Past Surgical History  Procedure Laterality Date  . Foot surgery Bilateral   . Colonoscopy  2010, 2013  . Breast biopsy Left 06/15/14    [place] bx /clip-neg/cyst  . Breast biopsy Right 07-05-15    8:30 location/atypical ductal hyperplasia  . Breast biopsy Right 07-05-15    fibroadenoma  . Abdominal hysterectomy  2008    [facility]  . Dilation and curettage of uterus    . Mouth surgery      DENTAL IMPLANTS  . Breast biopsy Right 07/21/2015    Procedure: BREAST BIOPSY WITH NEEDLE LOCALIZATION;  Surgeon: Robert Bellow, MD;  Location: [facility] ORS;  Service: General;  Laterality: Right;    Family History  Problem Relation Age of Onset  . Breast cancer Neg Hx     Social History Social History  Substance Use Topics  . Smoking status: Never Smoker   . Smokeless tobacco: Never Used  . Alcohol Use: No    Allergies  Allergen Reactions  . Clindamycin/Lincomycin Rash  . Penicillins Rash  . Sulfa Antibiotics Rash    Current Outpatient Prescriptions  Medication Sig Dispense Refill  . aspirin 81 MG tablet Take 81 mg by mouth daily.    Marland Kitchen atenolol (TENORMIN) 50 MG tablet Take 50 mg by mouth every morning.     Marland Kitchen atorvastatin (LIPITOR) 80 MG tablet Take 80 mg by mouth daily at 6 PM.     . Cholecalciferol (VITAMIN D3) 1000 UNITS CAPS Take by mouth 2 (two) times daily.     . enalapril (VASOTEC) 20 MG tablet Take 20 mg by mouth every morning.     Marland Kitchen glipiZIDE (GLUCOTROL XL) 2.5 MG 24 hr tablet Take 2.5 mg by mouth daily with breakfast.    . hydrochlorothiazide (HYDRODIURIL) 12.5 MG tablet Take 12.5 mg by mouth daily.    Marland Kitchen HYDROcodone-acetaminophen (NORCO) 5-325 MG tablet Take 1-2 tablets by mouth every 4 (four) hours as needed. 30 tablet 0  . metFORMIN (GLUCOPHAGE) 500 MG tablet Take 500 mg by mouth 2 (two) times daily with a meal.    . tamoxifen (NOLVADEX) 20 MG tablet Take 1 tablet (20 mg total) by mouth daily. 30 tablet 11   No current facility-administered medications for this visit.    Review of Systems Review of Systems  Constitutional: Negative.   Respiratory: Negative.   Cardiovascular: Negative.     Blood pressure 138/82, pulse 76, resp. rate 14, height 5\' 7"  (1.702 m), weight 292 lb (132.45 kg).  Physical Exam Physical Exam  Constitutional: She is oriented to person, place, and time. She appears well-developed and well-nourished.  Eyes: Conjunctivae are normal. No scleral icterus.  Neck: Neck supple.  Cardiovascular: Normal rate, regular rhythm and normal heart sounds.  Pulmonary/Chest: Effort normal and breath sounds normal.    Right breast incision is well healed.    Lymphadenopathy:    She has no cervical adenopathy.  Neurological: She is alert and oriented to person, place, and time.  Skin: Skin is warm and dry.      Assessment    Doing well status post wide excision for ADH. Good tolerance of tamoxifen therapy.      Plan    We will plan for a follow-up exam for a 1 year anniversary post wide excision. If she's doing well and her PCP will continue her tamoxifen, she can have annual screening mammograms afterwards.    Patient to return in 10 months bilateral diagnotic mammogram.  PCP:  Mercy Riding 09/07/2015, 3:16 PM

## 2015-09-07 NOTE — Patient Instructions (Signed)
Patient to return in 10 months bilateral diagnotic mammogram.

## 2016-02-13 IMAGING — MG MM RT PLC BREAST LOC DEV   1ST LESION  INC MAMMO GUIDE
9 series · 9 of 9 positions shown · non-contrast
Comparison: Previous exams.

CLINICAL DATA: 59-year-old female for wire localization of a coil
shaped biopsy marker at site of recent biopsy demonstrating atypical
ductal hyperplasia.

EXAM:
NEEDLE LOCALIZATION OF THE RIGHT BREAST WITH MAMMO GUIDANCE

[R CC (1 of 6)]
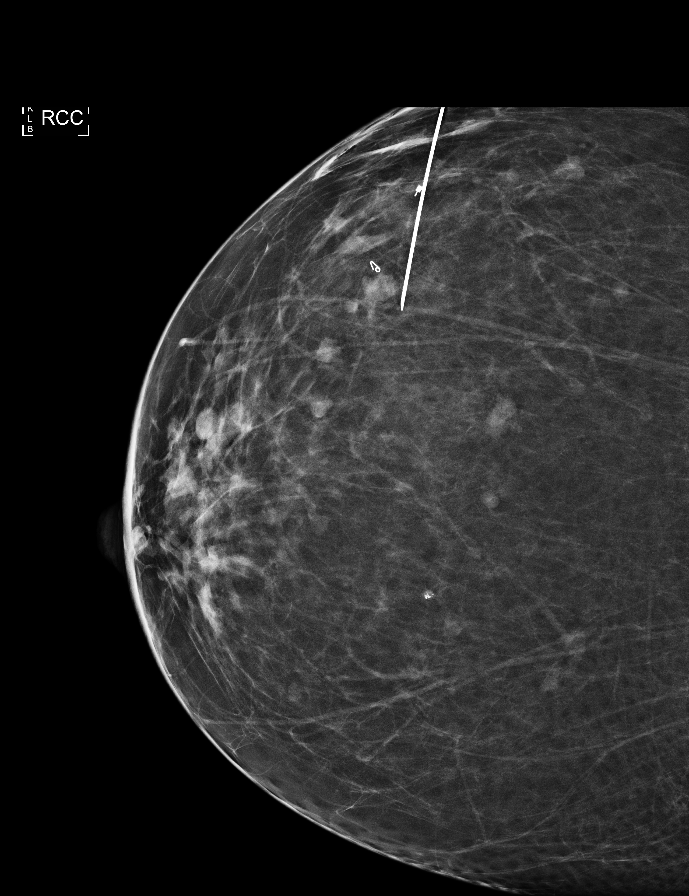

[R CC (2 of 6)]
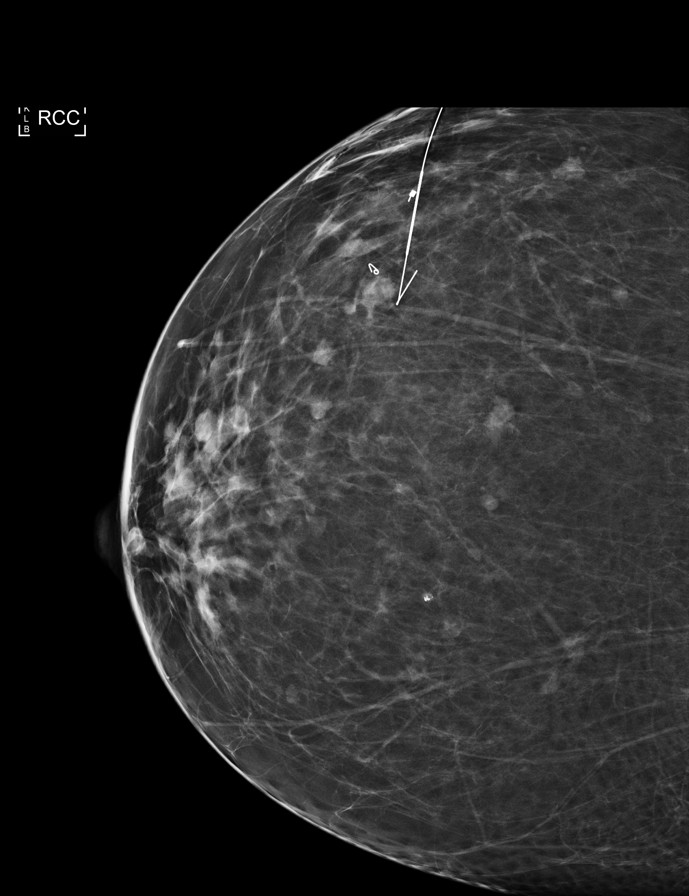

[R LM (1 of 3)]
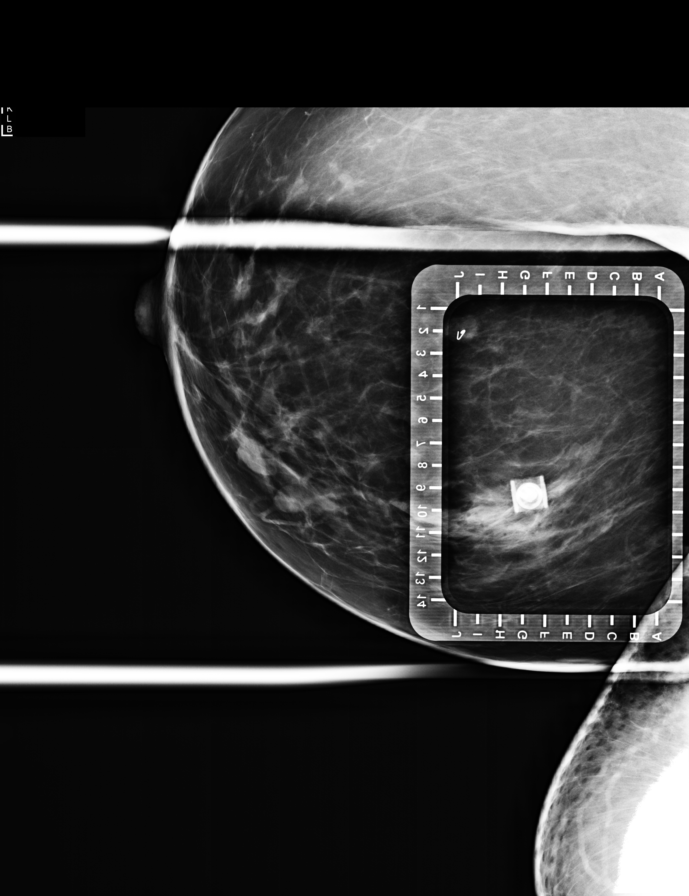

[R LM (2 of 3)]
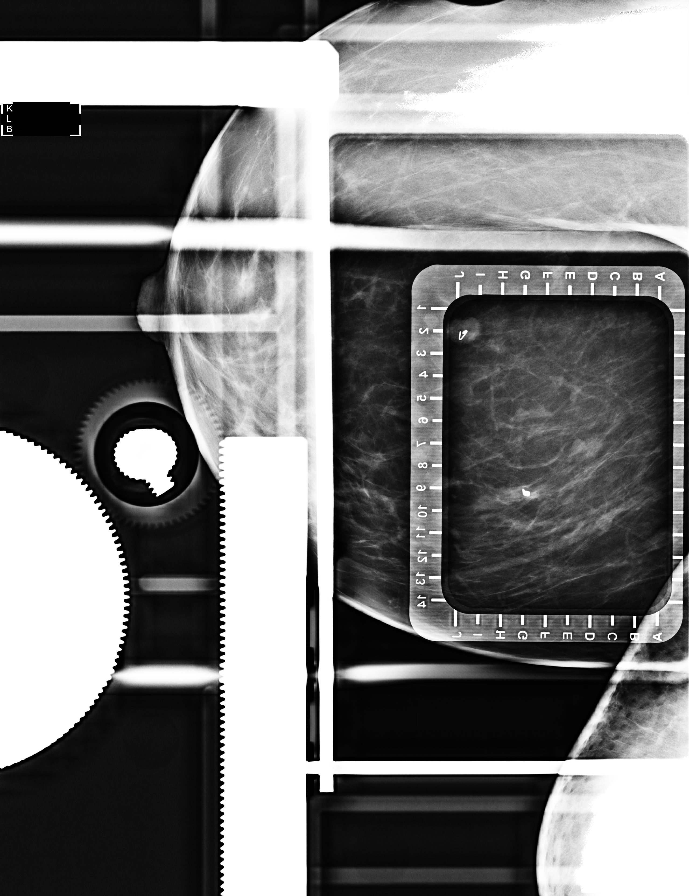

[R CC (3 of 6)]
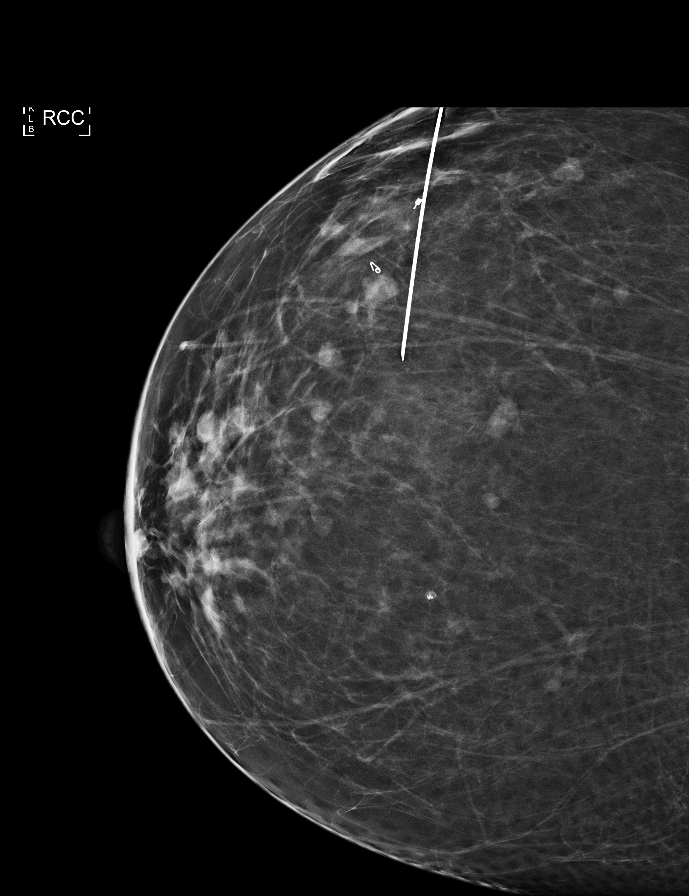

[R CC (4 of 6)]
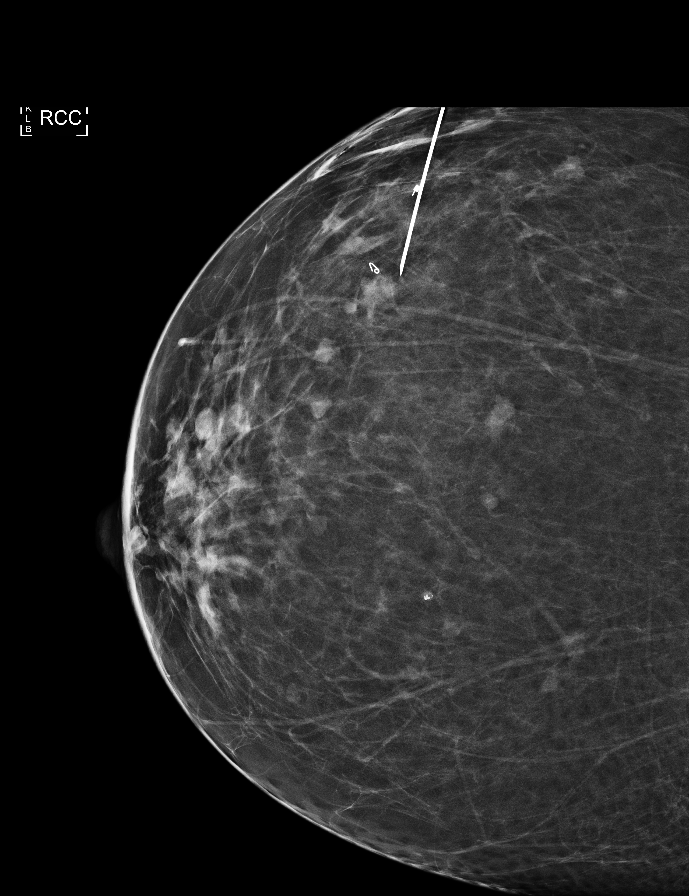

[R CC (5 of 6)]
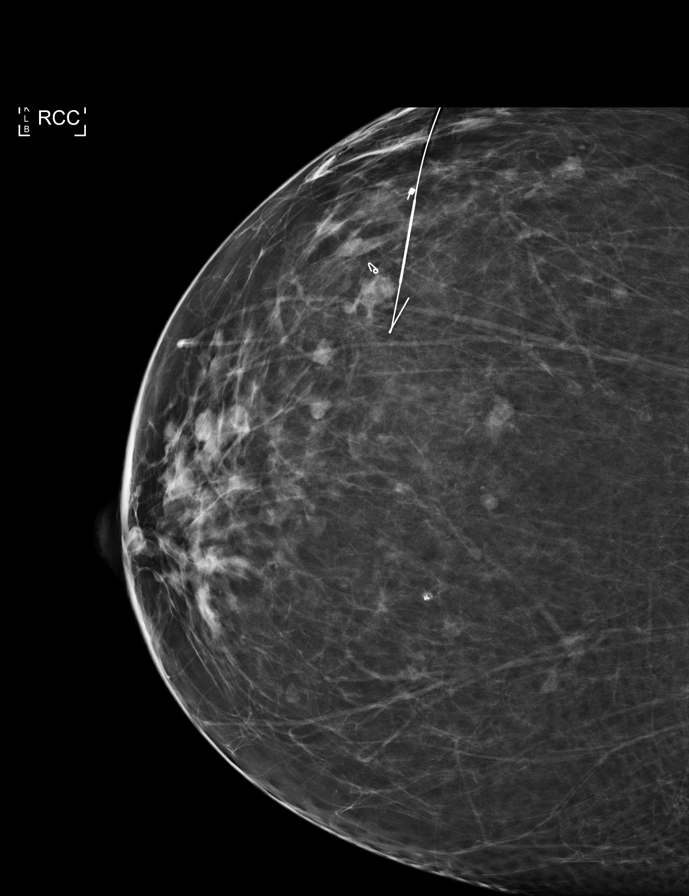

[R CC (6 of 6)]
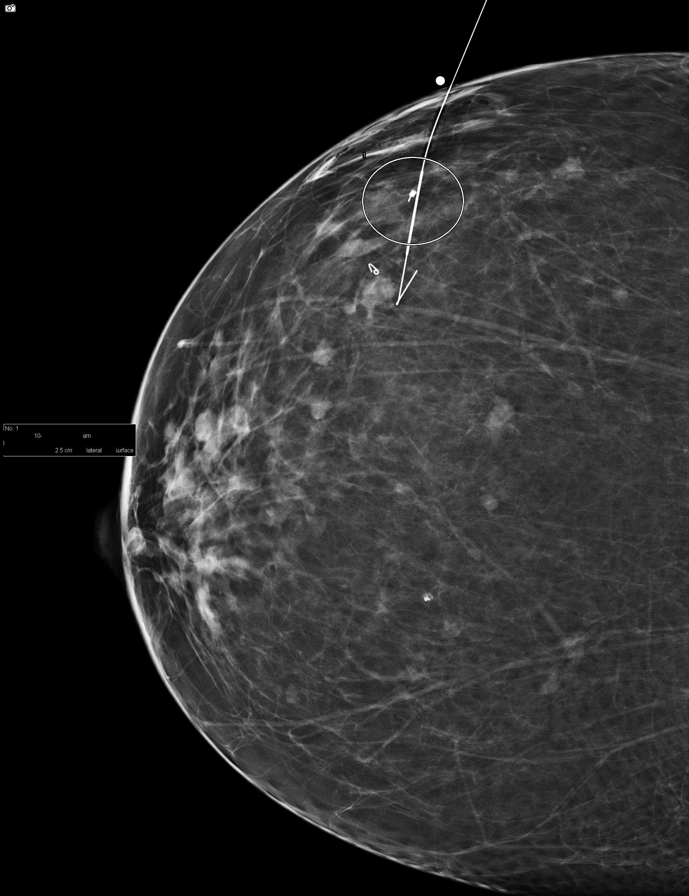

[R LM (3 of 3)]
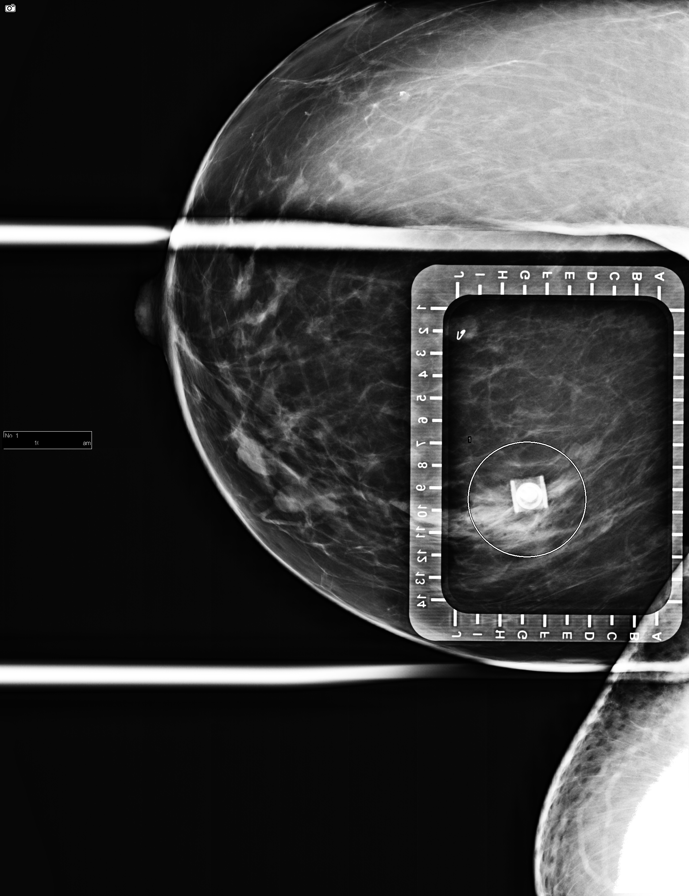

[9 of 9 positions shown; findings below may reference images not displayed]

FINDINGS: Patient presents for needle localization prior to right excisional
biopsy. I met with the patient and we discussed the procedure of
needle localization including benefits and alternatives. We
discussed the high likelihood of a successful procedure. We
discussed the risks of the procedure, including infection, bleeding,
tissue injury, and further surgery. Informed, written consent was
given. The usual time-out protocol was performed immediately prior
to the procedure.

Using mammographic guidance, sterile technique, 2% lidocaine and a 7
cm modified Kopans needle, the coil shaped biopsy marker was
localized using a lateral to medial approach. The images were marked
for Dr. Ana-Maria.
IMPRESSION: Needle localization of the right breast. No apparent complications.

## 2016-05-02 ENCOUNTER — Other Ambulatory Visit: Payer: Self-pay | Admitting: General Surgery

## 2016-05-02 DIAGNOSIS — N6091 Unspecified benign mammary dysplasia of right breast: Secondary | ICD-10-CM

## 2016-05-02 DIAGNOSIS — N6099 Unspecified benign mammary dysplasia of unspecified breast: Secondary | ICD-10-CM

## 2016-05-15 ENCOUNTER — Encounter: Payer: Self-pay | Admitting: *Deleted

## 2016-06-19 ENCOUNTER — Encounter: Payer: Self-pay | Admitting: *Deleted

## 2016-06-22 ENCOUNTER — Ambulatory Visit
Admission: RE | Admit: 2016-06-22 | Discharge: 2016-06-22 | Disposition: A | Discharge status: Home / Self Care | Payer: Managed Care, Other (non HMO) | Source: Ambulatory Visit | Attending: General Surgery | Admitting: General Surgery

## 2016-06-22 ENCOUNTER — Other Ambulatory Visit: Payer: Self-pay | Admitting: General Surgery

## 2016-06-22 DIAGNOSIS — N6091 Unspecified benign mammary dysplasia of right breast: Secondary | ICD-10-CM | POA: Insufficient documentation

## 2016-06-25 ENCOUNTER — Ambulatory Visit (INDEPENDENT_AMBULATORY_CARE_PROVIDER_SITE_OTHER): Payer: Managed Care, Other (non HMO) | Admitting: General Surgery

## 2016-06-25 ENCOUNTER — Encounter: Payer: Self-pay | Admitting: General Surgery

## 2016-06-25 VITALS — BP 130/80 | HR 74 | Ht 67.0 in | Wt 292.0 lb

## 2016-06-25 DIAGNOSIS — N62 Hypertrophy of breast: Secondary | ICD-10-CM

## 2016-06-25 DIAGNOSIS — N6091 Unspecified benign mammary dysplasia of right breast: Secondary | ICD-10-CM

## 2016-06-25 NOTE — Patient Instructions (Addendum)
Patient to have a mammogram with PCP and Tamoxifen. Return as needed.

## 2016-06-25 NOTE — Progress Notes (Signed)
Patient ID: Meagan Diaz, female   DOB: 03-Aug-1956, 60 y.o.   MRN: [id number]  Chief Complaint  Patient presents with  . Follow-up    mammogram    HPI Meagan Diaz is a 60 y.o. female who presents for a breast evaluation. The most recent mammogram was done on 06/22/16.  Patient does perform regular self breast checks and gets regular mammograms done.  Patient is doing well in Tamoxifen.  HPI  Past Medical History:  Diagnosis Date  . Anemia    H/O  . Cancer [facility]) 2008   uterine ca  . Colon polyp 2010  . Diabetes mellitus without complication ([place]) 123456  . Hyperlipidemia   . Hypertension 2006  . Kidney stone   . PONV (postoperative nausea and vomiting)     Past Surgical History:  Procedure Laterality Date  . ABDOMINAL HYSTERECTOMY  2008   [facility]  . BREAST BIOPSY Left 06/15/14   [place] bx /clip-neg/cyst  . BREAST BIOPSY Right 07-05-15   8:30 location/atypical ductal hyperplasia  . BREAST BIOPSY Right 07-05-15   fibroadenoma  . BREAST BIOPSY Right 07/21/2015   Procedure: BREAST BIOPSY WITH NEEDLE LOCALIZATION;  Surgeon: Robert Bellow, MD;  Location: [facility] ORS;  Service: General;  Laterality: Right;  . COLONOSCOPY  2010, 2013  . DILATION AND CURETTAGE OF UTERUS    . FOOT SURGERY Bilateral   . MOUTH SURGERY     DENTAL IMPLANTS    Family History  Problem Relation Age of Onset  . Breast cancer Neg Hx     Social History Social History  Substance Use Topics  . Smoking status: Never Smoker  . Smokeless tobacco: Never Used  . Alcohol use No    Allergies  Allergen Reactions  . Clindamycin/Lincomycin Rash  . Penicillins Rash  . Sulfa Antibiotics Rash    Current Outpatient Prescriptions  Medication Sig Dispense Refill  . aspirin 81 MG tablet Take 81 mg by mouth daily.    Marland Kitchen atenolol (TENORMIN) 50 MG tablet Take 50 mg by mouth every morning.     Marland Kitchen atorvastatin (LIPITOR) 80 MG tablet Take 80 mg by mouth daily at 6 PM.     . Cholecalciferol (VITAMIN  D3) 1000 UNITS CAPS Take by mouth 2 (two) times daily.    . enalapril (VASOTEC) 20 MG tablet Take 20 mg by mouth every morning.     Marland Kitchen glipiZIDE (GLUCOTROL XL) 2.5 MG 24 hr tablet Take 2.5 mg by mouth daily with breakfast.    . hydrochlorothiazide (HYDRODIURIL) 12.5 MG tablet Take 12.5 mg by mouth daily.    . metFORMIN (GLUCOPHAGE) 500 MG tablet Take 500 mg by mouth 2 (two) times daily with a meal.    . tamoxifen (NOLVADEX) 20 MG tablet Take 1 tablet (20 mg total) by mouth daily. 30 tablet 11   No current facility-administered medications for this visit.     Review of Systems Review of Systems  Constitutional: Negative.   Respiratory: Negative.   Cardiovascular: Negative.     Blood pressure 130/80, pulse 74, height 5\' 7"  (1.702 m), weight 292 lb (132.5 kg).  Physical Exam Physical Exam  Constitutional: She is oriented to person, place, and time. She appears well-developed and well-nourished.  Eyes: Conjunctivae are normal. No scleral icterus.  Neck: Neck supple.  Cardiovascular: Normal rate, regular rhythm and normal heart sounds.   Pulmonary/Chest: Effort normal and breath sounds normal. Right breast exhibits no inverted nipple, no mass, no nipple discharge, no skin change and  no tenderness. Left breast exhibits no inverted nipple, no mass, no nipple discharge, no skin change and no tenderness.    Abdominal: Soft. Bowel sounds are normal. There is no tenderness.  Lymphadenopathy:    She has no cervical adenopathy.    She has no axillary adenopathy.  Neurological: She is alert and oriented to person, place, and time.  Skin: Skin is warm and dry.    Data Reviewed Bilateral mammograms dated 06/22/2016 were reviewed. Postsurgical changes in the treated breast. BI-RADS-2.  Assessment    Atypical ductal hyperplasia of the right breast, elevated Gail risk score.    Plan    The patient is tolerating her tamoxifen therapy well. Should like to have her annual follow-ups completed  with her PCP. This is reasonable. They should continue her on tamoxifen for a total 4 years. If there is any question about follow-up the patient was encouraged to call.   Patient to have a mammogram with PCP and Tamoxifen. Return as needed.  This information has been scribed by Gaspar Cola CMA.   Robert Bellow 06/25/2016, 10:01 PM

## 2016-07-24 ENCOUNTER — Other Ambulatory Visit: Payer: Self-pay | Admitting: General Surgery

## 2016-07-24 DIAGNOSIS — N6091 Unspecified benign mammary dysplasia of right breast: Secondary | ICD-10-CM

## 2016-07-24 NOTE — Telephone Encounter (Signed)
Future refills by PCP

## 2016-09-23 ENCOUNTER — Other Ambulatory Visit: Payer: Self-pay | Admitting: General Surgery

## 2016-09-23 DIAGNOSIS — N6091 Unspecified benign mammary dysplasia of right breast: Secondary | ICD-10-CM

## 2016-11-22 ENCOUNTER — Other Ambulatory Visit: Payer: Self-pay | Admitting: General Surgery

## 2016-11-22 DIAGNOSIS — N6091 Unspecified benign mammary dysplasia of right breast: Secondary | ICD-10-CM

## 2016-11-22 NOTE — Telephone Encounter (Signed)
Future refills with PCP

## 2017-05-15 ENCOUNTER — Other Ambulatory Visit: Payer: Self-pay | Admitting: Family Medicine

## 2017-05-15 DIAGNOSIS — Z1231 Encounter for screening mammogram for malignant neoplasm of breast: Secondary | ICD-10-CM

## 2017-06-24 ENCOUNTER — Ambulatory Visit
Admission: RE | Admit: 2017-06-24 | Discharge: 2017-06-24 | Disposition: A | Discharge status: Home / Self Care | Payer: 59 | Source: Ambulatory Visit | Attending: Family Medicine | Admitting: Family Medicine

## 2017-06-24 DIAGNOSIS — Z1231 Encounter for screening mammogram for malignant neoplasm of breast: Secondary | ICD-10-CM | POA: Diagnosis present

## 2017-07-18 ENCOUNTER — Ambulatory Visit (INDEPENDENT_AMBULATORY_CARE_PROVIDER_SITE_OTHER): Payer: 59 | Admitting: Obstetrics and Gynecology

## 2017-07-18 ENCOUNTER — Encounter: Payer: Self-pay | Admitting: Obstetrics and Gynecology

## 2017-07-18 VITALS — BP 122/78 | HR 56 | Ht 67.0 in | Wt 301.0 lb

## 2017-07-18 DIAGNOSIS — Z1231 Encounter for screening mammogram for malignant neoplasm of breast: Secondary | ICD-10-CM

## 2017-07-18 DIAGNOSIS — Z1239 Encounter for other screening for malignant neoplasm of breast: Secondary | ICD-10-CM

## 2017-07-18 DIAGNOSIS — Z01419 Encounter for gynecological examination (general) (routine) without abnormal findings: Secondary | ICD-10-CM

## 2017-07-18 NOTE — Progress Notes (Signed)
PCP: Frazier Richards, MD   Chief Complaint  Patient presents with  . Gynecologic Exam    HPI:      [name] is a 61 y.o. G0P0000 who LMP was No LMP recorded. Patient has had a hysterectomy., presents today for her annual examination.  Her menses are absent due to [facility] 2008 due to uterine cancer.  She does not have intermenstrual bleeding.  She does have vasomotor sx due to tamoxifen use.   Sex activity: single partner, contraception - status post hysterectomy. She does have vaginal dryness, uses lubricants with relief.  Last Pap: July 23, 2016  Results were: no abnormalities /neg HPV DNA.  Hx of STDs: none  Last mammogram: June 24, 2017  Results were: normal--routine follow-up in 12 months There is no FH of breast cancer. There is no FH of ovarian cancer. The patient does do self-breast exams. She has a hx of atypical breast cells on bx and will be on tamoxifen for 5 yrs with Dr. Bary Castilla. She has completed 2 yrs.  Colonoscopy: colonoscopy 5 years ago without abnormalities. . Repeat due after 10 years.   Tobacco use: The patient denies current or previous tobacco use. Alcohol use: none Exercise: moderately active  She does get adequate calcium and Vitamin D in her diet.  Labs with PCP.  Past Medical History:  Diagnosis Date  . Anemia    H/O  . Cancer [facility]) 2008   uterine ca  . Colon polyp 2010  . Diabetes mellitus without complication ([place]) 4097  . Hyperlipidemia   . Hypertension 2006  . Kidney stone   . PONV (postoperative nausea and vomiting)     Past Surgical History:  Procedure Laterality Date  . ABDOMINAL HYSTERECTOMY  2008   [facility]  . BREAST BIOPSY Left 06/15/14   [place] bx /clip-neg/cyst  . BREAST BIOPSY Right 07-05-15   8:30 location/atypical ductal hyperplasia  . BREAST BIOPSY Right 07-05-15   fibroadenoma  . BREAST BIOPSY Right 07/21/2015   Procedure: BREAST BIOPSY WITH NEEDLE LOCALIZATION;  Surgeon: Robert Bellow, MD;  Location: [facility] ORS;  Service: General;  Laterality: Right;  . COLONOSCOPY  2010, 2013  . DILATION AND CURETTAGE OF UTERUS    . FOOT SURGERY Bilateral   . MOUTH SURGERY     DENTAL IMPLANTS    Family History  Problem Relation Age of Onset  . Diabetes Mother   . Hypertension Father   . Diabetes Maternal Grandmother   . Breast cancer Neg Hx     Social History   Social History  . Marital status: Married    Spouse name: N/A  . Number of children: N/A  . Years of education: N/A   Occupational History  . Not on file.   Social History Main Topics  . Smoking status: Never Smoker  . Smokeless tobacco: Never Used  . Alcohol use No  . Drug use: No  . Sexual activity: Yes    Birth control/ protection: Post-menopausal   Other Topics Concern  . Not on file   Social History Narrative  . No narrative on file    Current Meds  Medication Sig  . aspirin 81 MG tablet Take 81 mg by mouth daily.  Marland Kitchen atenolol (TENORMIN) 50 MG tablet Take 50 mg by mouth every morning.   Marland Kitchen atorvastatin (LIPITOR) 80 MG tablet Take 80 mg by mouth daily at 6 PM.   . Cholecalciferol (VITAMIN D3) 1000 UNITS CAPS Take by mouth  2 (two) times daily.  . enalapril (VASOTEC) 20 MG tablet Take 20 mg by mouth every morning.   Marland Kitchen glipiZIDE (GLUCOTROL XL) 2.5 MG 24 hr tablet Take 2.5 mg by mouth daily with breakfast.  . hydrochlorothiazide (HYDRODIURIL) 12.5 MG tablet Take 12.5 mg by mouth daily.  . metFORMIN (GLUCOPHAGE) 500 MG tablet Take 500 mg by mouth 2 (two) times daily with a meal.  . tamoxifen (NOLVADEX) 20 MG tablet TAKE ONE TABLET BY MOUTH ONCE DAILY      ROS:  Review of Systems  Constitutional: Negative for fatigue, fever and unexpected weight change.  Respiratory: Negative for cough, shortness of breath and wheezing.   Cardiovascular: Negative for chest pain, palpitations and leg swelling.  Gastrointestinal: Negative for blood in stool, constipation, diarrhea, nausea and vomiting.    Endocrine: Negative for cold intolerance, heat intolerance and polyuria.  Genitourinary: Negative for dyspareunia, dysuria, flank pain, frequency, genital sores, hematuria, menstrual problem, pelvic pain, urgency, vaginal bleeding, vaginal discharge and vaginal pain.  Musculoskeletal: Negative for back pain, joint swelling and myalgias.  Skin: Negative for rash.  Neurological: Negative for dizziness, syncope, light-headedness, numbness and headaches.  Hematological: Negative for adenopathy.  Psychiatric/Behavioral: Negative for agitation, confusion, sleep disturbance and suicidal ideas. The patient is not nervous/anxious.      Objective: BP 122/78   Pulse (!) 56   Ht 5\' 7"  (1.702 m)   Wt (!) 301 lb (136.5 kg)   BMI 47.14 kg/m    Physical Exam  Constitutional: She is oriented to person, place, and time. She appears well-developed and well-nourished.  Genitourinary: Vagina normal. There is no rash or tenderness on the right labia. There is no rash or tenderness on the left labia. No erythema or tenderness in the vagina. No vaginal discharge found. Right adnexum does not display mass and does not display tenderness. Left adnexum does not display mass and does not display tenderness.  Genitourinary Comments: UTERUS/CX SURG REM  Neck: Normal range of motion. No thyromegaly present.  Cardiovascular: Normal rate, regular rhythm and normal heart sounds.   No murmur heard. Pulmonary/Chest: Effort normal and breath sounds normal. Right breast exhibits no mass, no nipple discharge, no skin change and no tenderness. Left breast exhibits no mass, no nipple discharge, no skin change and no tenderness.  Abdominal: Soft. There is no tenderness. There is no guarding.  Musculoskeletal: Normal range of motion.  Neurological: She is alert and oriented to person, place, and time. No cranial nerve deficit.  Psychiatric: She has a normal mood and affect. Her behavior is normal.  Vitals  reviewed.   Assessment/Plan:  Encounter for annual routine gynecological examination  Screening for breast cancer - Pt current on mammo.          GYN counsel mammography screening, adequate intake of calcium and vitamin D, diet and exercise    F/U  Return in about 1 year (around 07/18/2018).  Alicia B. Copland, PA-C 07/18/2017 9:23 AM

## 2018-05-20 ENCOUNTER — Other Ambulatory Visit: Payer: Self-pay | Admitting: Family Medicine

## 2018-05-20 DIAGNOSIS — Z1231 Encounter for screening mammogram for malignant neoplasm of breast: Secondary | ICD-10-CM

## 2018-06-25 ENCOUNTER — Encounter (INDEPENDENT_AMBULATORY_CARE_PROVIDER_SITE_OTHER): Payer: Self-pay

## 2018-06-25 ENCOUNTER — Ambulatory Visit
Admission: RE | Admit: 2018-06-25 | Discharge: 2018-06-25 | Disposition: A | Discharge status: Home / Self Care | Payer: 59 | Source: Ambulatory Visit | Attending: Family Medicine | Admitting: Family Medicine

## 2018-06-25 DIAGNOSIS — Z1231 Encounter for screening mammogram for malignant neoplasm of breast: Secondary | ICD-10-CM | POA: Diagnosis present

## 2018-08-11 ENCOUNTER — Ambulatory Visit (INDEPENDENT_AMBULATORY_CARE_PROVIDER_SITE_OTHER): Payer: 59 | Admitting: Obstetrics and Gynecology

## 2018-08-11 ENCOUNTER — Encounter: Payer: Self-pay | Admitting: Obstetrics and Gynecology

## 2018-08-11 VITALS — BP 110/70 | HR 77 | Ht 66.0 in | Wt 293.0 lb

## 2018-08-11 DIAGNOSIS — Z01419 Encounter for gynecological examination (general) (routine) without abnormal findings: Secondary | ICD-10-CM

## 2018-08-11 DIAGNOSIS — Z1239 Encounter for other screening for malignant neoplasm of breast: Secondary | ICD-10-CM

## 2018-08-11 NOTE — Progress Notes (Signed)
PCP: Frazier Richards, MD   Chief Complaint  Patient presents with  . Gynecologic Exam    HPI:      [name] is a 62 y.o. G0P0000 who LMP was No LMP recorded. Patient has had a hysterectomy., presents today for her annual examination.  Her menses are absent due to [facility] 2008 due to uterine cancer.  She does not have intermenstrual bleeding.  She does have vasomotor sx due to tamoxifen use.   Sex activity: single partner, contraception - status post hysterectomy. She does have vaginal dryness, uses lubricants with relief.  Last Pap: July 23, 2016  Results were: no abnormalities /neg HPV DNA.  Hx of STDs: none  Last mammogram: 06/25/18  Results were: normal--routine follow-up in 12 months There is a FH of breast cancer in her sister, genetic testing not indicated. There is no FH of ovarian cancer. The patient does do self-breast exams. 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 Bary Castilla, managed by PCP. She has completed 3 yrs.  Colonoscopy: colonoscopy 6 years ago without abnormalities. Repeat due after 10 years.   Tobacco use: The patient denies current or previous tobacco use. Alcohol use: none Exercise: moderately active  She does get adequate calcium and Vitamin D in her diet.  Labs with PCP.  Past Medical History:  Diagnosis Date  . Anemia    H/O  . Cancer [facility]) 2008   uterine ca  . Colon polyp 2010  . Diabetes mellitus without complication ([place]) 9563  . Hyperlipidemia   . Hypertension 2006  . Kidney stone   . PONV (postoperative nausea and vomiting)     Past Surgical History:  Procedure Laterality Date  . ABDOMINAL HYSTERECTOMY  2008   [facility]  . BREAST BIOPSY Left 06/15/14   [place] bx /clip-neg/cyst  . BREAST BIOPSY Right 07-05-15   8:30 location/atypical ductal hyperplasia  . BREAST BIOPSY Right 07-05-15   fibroadenoma  . BREAST BIOPSY Right 07/21/2015   Procedure: BREAST BIOPSY  WITH NEEDLE LOCALIZATION;  Surgeon: Robert Bellow, MD;  Location: [facility] ORS;  Service: General;  Laterality: Right;  . COLONOSCOPY  2010, 2013  . DILATION AND CURETTAGE OF UTERUS    . FOOT SURGERY Bilateral   . MOUTH SURGERY     DENTAL IMPLANTS    Family History  Problem Relation Age of Onset  . Diabetes Mother        DM Type 2  . Heart disease Mother   . Hypertension Father   . Congestive Heart Failure Father   . Heart disease Father   . Diabetes Maternal Grandmother   . Breast cancer Sister 28       Just diagnosed 2019    Social History   Socioeconomic History  . Marital status: Married    Spouse name: Not on file  . Number of children: Not on file  . Years of education: Not on file  . Highest education level: Not on file  Occupational History  . Not on file  Social Needs  . Financial resource strain: Not on file  . Food insecurity:    Worry: Not on file    Inability: Not on file  . Transportation needs:    Medical: Not on file    Non-medical: Not on file  Tobacco Use  . Smoking status: Never Smoker  . Smokeless tobacco: Never Used  Substance and Sexual Activity  .  Alcohol use: No    Alcohol/week: 0.0 standard drinks  . Drug use: No  . Sexual activity: Yes    Birth control/protection: Post-menopausal  Lifestyle  . Physical activity:    Days per week: Not on file    Minutes per session: Not on file  . Stress: Not on file  Relationships  . Social connections:    Talks on phone: Not on file    Gets together: Not on file    Attends religious service: Not on file    Active member of club or organization: Not on file    Attends meetings of clubs or organizations: Not on file    Relationship status: Not on file  . Intimate partner violence:    Fear of current or ex partner: Not on file    Emotionally abused: Not on file    Physically abused: Not on file    Forced sexual activity: Not on file  Other Topics Concern  . Not on file  Social History  Narrative  . Not on file    Current Meds  Medication Sig  . aspirin 81 MG tablet Take 81 mg by mouth daily.  Marland Kitchen atenolol (TENORMIN) 50 MG tablet Take 50 mg by mouth every morning.   Marland Kitchen atorvastatin (LIPITOR) 80 MG tablet Take 80 mg by mouth daily at 6 PM.   . Cholecalciferol (VITAMIN D3) 1000 UNITS CAPS Take by mouth 2 (two) times daily.  . enalapril (VASOTEC) 20 MG tablet Take 20 mg by mouth every morning.   Marland Kitchen glipiZIDE (GLUCOTROL XL) 2.5 MG 24 hr tablet Take 2.5 mg by mouth daily with breakfast.  . hydrochlorothiazide (HYDRODIURIL) 12.5 MG tablet Take 12.5 mg by mouth daily.  . metFORMIN (GLUCOPHAGE-XR) 500 MG 24 hr tablet Take 500 mg by mouth 2 (two) times daily.  . tamoxifen (NOLVADEX) 20 MG tablet TAKE ONE TABLET BY MOUTH ONCE DAILY      ROS:  Review of Systems  Constitutional: Negative for fatigue, fever and unexpected weight change.  Respiratory: Negative for cough, shortness of breath and wheezing.   Cardiovascular: Negative for chest pain, palpitations and leg swelling.  Gastrointestinal: Negative for blood in stool, constipation, diarrhea, nausea and vomiting.  Endocrine: Negative for cold intolerance, heat intolerance and polyuria.  Genitourinary: Negative for dyspareunia, dysuria, flank pain, frequency, genital sores, hematuria, menstrual problem, pelvic pain, urgency, vaginal bleeding, vaginal discharge and vaginal pain.  Musculoskeletal: Negative for back pain, joint swelling and myalgias.  Skin: Negative for rash.  Neurological: Negative for dizziness, syncope, light-headedness, numbness and headaches.  Hematological: Negative for adenopathy.  Psychiatric/Behavioral: Negative for agitation, confusion, sleep disturbance and suicidal ideas. The patient is not nervous/anxious.      Objective: BP 110/70   Pulse 77   Ht 5\' 6"  (1.676 m)   Wt 293 lb (132.9 kg)   BMI 47.29 kg/m    Physical Exam  Constitutional: She is oriented to person, place, and time. She appears  well-developed and well-nourished.  Genitourinary: Vagina normal. There is no rash or tenderness on the right labia. There is no rash or tenderness on the left labia. No erythema or tenderness in the vagina. No vaginal discharge found. Right adnexum does not display mass and does not display tenderness. Left adnexum does not display mass and does not display tenderness.  Genitourinary Comments: UTERUS/CX SURG REM  Neck: Normal range of motion. No thyromegaly present.  Cardiovascular: Normal rate, regular rhythm and normal heart sounds.  No murmur heard. Pulmonary/Chest: Effort normal  and breath sounds normal. Right breast exhibits no mass, no nipple discharge, no skin change and no tenderness. Left breast exhibits no mass, no nipple discharge, no skin change and no tenderness.  Abdominal: Soft. There is no tenderness. There is no guarding.  Musculoskeletal: Normal range of motion.  Neurological: She is alert and oriented to person, place, and time. No cranial nerve deficit.  Psychiatric: She has a normal mood and affect. Her behavior is normal.  Vitals reviewed.   Assessment/Plan:  Encounter for annual routine gynecological examination  Screening for breast cancer - Pt current on mammo, followed by PCP with Tamoxifen          GYN counsel mammography screening, adequate intake of calcium and vitamin D, diet and exercise    F/U  Return in about 1 year (around 08/12/2019).  Tiffini Blacksher B. Deen Deguia, PA-C 08/11/2018 8:42 AM

## 2018-08-11 NOTE — Patient Instructions (Signed)
I value your feedback and entrusting us with your care. If you get a [place] patient survey, I would appreciate you taking the time to let us know about your experience today. Thank you! 

## 2019-05-18 ENCOUNTER — Other Ambulatory Visit: Payer: Self-pay | Admitting: Family Medicine

## 2019-05-18 DIAGNOSIS — Z1231 Encounter for screening mammogram for malignant neoplasm of breast: Secondary | ICD-10-CM

## 2019-07-01 ENCOUNTER — Other Ambulatory Visit: Payer: Self-pay

## 2019-07-01 ENCOUNTER — Ambulatory Visit
Admission: RE | Admit: 2019-07-01 | Discharge: 2019-07-01 | Disposition: A | Discharge status: Home / Self Care | Payer: 59 | Source: Ambulatory Visit | Attending: Family Medicine | Admitting: Family Medicine

## 2019-07-01 DIAGNOSIS — Z1231 Encounter for screening mammogram for malignant neoplasm of breast: Secondary | ICD-10-CM | POA: Diagnosis not present

## 2019-09-28 NOTE — Progress Notes (Signed)
PCP: Frazier Richards, MD   Chief Complaint  Patient presents with  . Gynecologic Exam    HPI:      [name] is a 63 y.o. G0P0000 who LMP was No LMP recorded. Patient has had a hysterectomy., presents today for her annual examination.  Her menses are absent due to [facility] 2008 due to uterine cancer.  She does not have intermenstrual bleeding.  She does have vasomotor sx due to tamoxifen use.   Sex activity: single partner, contraception - status post hysterectomy. She does have vaginal dryness, uses lubricants with relief.  Last Pap: July 23, 2016  Results were: no abnormalities /neg HPV DNA.  Hx of STDs: none  Last mammogram: 07/01/19  Results were: normal--routine follow-up in 12 months There is a FH of breast cancer in her sister and mat cousin, genetic testing not indicated for pt. There is no FH of ovarian cancer. The patient does do self-breast exams. 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 Bary Castilla, managed by PCP. She has completed 4 yrs.  Colonoscopy: colonoscopy 7 years ago without abnormalities. Repeat due after 10 years.   Tobacco use: The patient denies current or previous tobacco use. Alcohol use: none  No drug use Exercise: moderately active  She does get adequate calcium and Vitamin D in her diet.  Labs with PCP.  Past Medical History:  Diagnosis Date  . Anemia    H/O  . Cancer [facility]) 2008   uterine ca  . Colon polyp 2010  . Diabetes mellitus without complication ([place]) 123456  . Hyperlipidemia   . Hypertension 2006  . Kidney stone   . PONV (postoperative nausea and vomiting)     Past Surgical History:  Procedure Laterality Date  . ABDOMINAL HYSTERECTOMY  2008   [facility]  . BREAST BIOPSY Left 06/15/14   [place] bx /clip-neg/cyst  . BREAST BIOPSY Right 07-05-15   8:30 location/atypical ductal hyperplasia  . BREAST BIOPSY Right 07-05-15   fibroadenoma  . BREAST BIOPSY Right  07/21/2015   Procedure: BREAST BIOPSY WITH NEEDLE LOCALIZATION;  Surgeon: Robert Bellow, MD;  Location: [facility] ORS;  Service: General;  Laterality: Right;  . COLONOSCOPY  2010, 2013  . DILATION AND CURETTAGE OF UTERUS    . FOOT SURGERY Bilateral   . MOUTH SURGERY     DENTAL IMPLANTS    Family History  Problem Relation Age of Onset  . Diabetes Mother        DM Type 2  . Heart disease Mother   . Hypertension Father   . Congestive Heart Failure Father   . Heart disease Father   . Diabetes Maternal Grandmother   . Breast cancer Sister 7       Just diagnosed 2019  . Breast cancer Cousin 63       mat cousin    Social History   Socioeconomic History  . Marital status: Married    Spouse name: Not on file  . Number of children: Not on file  . Years of education: Not on file  . Highest education level: Not on file  Occupational History  . Not on file  Tobacco Use  . Smoking status: Never Smoker  . Smokeless tobacco: Never Used  Substance and Sexual Activity  . Alcohol use: No    Alcohol/week: 0.0 standard drinks  . Drug use: No  . Sexual activity: Yes  Birth control/protection: Post-menopausal, Surgical    Comment: Hysterectomy  Other Topics Concern  . Not on file  Social History Narrative  . Not on file   Social Determinants of Health   Financial Resource Strain:   . Difficulty of Paying Living Expenses: Not on file  Food Insecurity:   . Worried About Charity fundraiser in the Last Year: Not on file  . Ran Out of Food in the Last Year: Not on file  Transportation Needs:   . Lack of Transportation (Medical): Not on file  . Lack of Transportation (Non-Medical): Not on file  Physical Activity:   . Days of Exercise per Week: Not on file  . Minutes of Exercise per Session: Not on file  Stress:   . Feeling of Stress : Not on file  Social Connections:   . Frequency of Communication with Friends and Family: Not on file  . Frequency of Social Gatherings with  Friends and Family: Not on file  . Attends Religious Services: Not on file  . Active Member of Clubs or Organizations: Not on file  . Attends Archivist Meetings: Not on file  . Marital Status: Not on file  Intimate Partner Violence:   . Fear of Current or Ex-Partner: Not on file  . Emotionally Abused: Not on file  . Physically Abused: Not on file  . Sexually Abused: Not on file    Current Meds  Medication Sig  . aspirin 81 MG tablet Take 81 mg by mouth daily.  Marland Kitchen atenolol (TENORMIN) 50 MG tablet Take 50 mg by mouth every morning.   Marland Kitchen atorvastatin (LIPITOR) 80 MG tablet Take 80 mg by mouth daily at 6 PM.   . Cholecalciferol (VITAMIN D3) 1000 UNITS CAPS Take by mouth 2 (two) times daily.  . enalapril (VASOTEC) 20 MG tablet Take 20 mg by mouth every morning.   Marland Kitchen glipiZIDE (GLUCOTROL XL) 2.5 MG 24 hr tablet Take 2.5 mg by mouth daily with breakfast.  . hydrochlorothiazide (HYDRODIURIL) 12.5 MG tablet Take 12.5 mg by mouth daily.  . metFORMIN (GLUCOPHAGE-XR) 500 MG 24 hr tablet Take 500 mg by mouth 2 (two) times daily.  . tamoxifen (NOLVADEX) 20 MG tablet TAKE ONE TABLET BY MOUTH ONCE DAILY      ROS:  Review of Systems  Constitutional: Negative for fatigue, fever and unexpected weight change.  Respiratory: Negative for cough, shortness of breath and wheezing.   Cardiovascular: Negative for chest pain, palpitations and leg swelling.  Gastrointestinal: Negative for blood in stool, constipation, diarrhea, nausea and vomiting.  Endocrine: Negative for cold intolerance, heat intolerance and polyuria.  Genitourinary: Negative for dyspareunia, dysuria, flank pain, frequency, genital sores, hematuria, menstrual problem, pelvic pain, urgency, vaginal bleeding, vaginal discharge and vaginal pain.  Musculoskeletal: Negative for back pain, joint swelling and myalgias.  Skin: Negative for rash.  Neurological: Negative for dizziness, syncope, light-headedness, numbness and headaches.   Hematological: Negative for adenopathy.  Psychiatric/Behavioral: Negative for agitation, confusion, sleep disturbance and suicidal ideas. The patient is not nervous/anxious.      Objective: BP 100/80   Ht 5\' 7"  (1.702 m)   Wt 292 lb (132.5 kg)   BMI 45.73 kg/m    Physical Exam Constitutional:      Appearance: She is well-developed.  Genitourinary:     Vulva and vagina normal.     No vaginal discharge, erythema or tenderness.     Cervix is absent.     Uterus is not tender.  Uterus is absent.     No right or left adnexal mass present.     Right adnexa absent.     Right adnexa not tender.     Left adnexa absent.     Left adnexa not tender.     Genitourinary Comments: UTERUS/CX SURG REM  Neck:     Thyroid: No thyromegaly.  Cardiovascular:     Rate and Rhythm: Normal rate and regular rhythm.     Heart sounds: Normal heart sounds. No murmur.  Pulmonary:     Effort: Pulmonary effort is normal.     Breath sounds: Normal breath sounds.  Chest:     Breasts:        Right: No mass, nipple discharge, skin change or tenderness.        Left: No mass, nipple discharge, skin change or tenderness.  Abdominal:     Palpations: Abdomen is soft.     Tenderness: There is no abdominal tenderness. There is no guarding.  Musculoskeletal:        General: Normal range of motion.     Cervical back: Normal range of motion.  Neurological:     General: No focal deficit present.     Mental Status: She is alert and oriented to person, place, and time.     Cranial Nerves: No cranial nerve deficit.  Skin:    General: Skin is warm and dry.  Psychiatric:        Mood and Affect: Mood normal.        Behavior: Behavior normal.        Thought Content: Thought content normal.        Judgment: Judgment normal.  Vitals reviewed.     Assessment/Plan:  Encounter for annual routine gynecological examination  Encounter for screening mammogram for malignant neoplasm of breast--pt current on  mammo. Seeing PCP for tamoxifen.          GYN counsel mammography screening, adequate intake of calcium and vitamin D, diet and exercise    F/U  Return in about 1 year (around 09/28/2020).  Lendy Dittrich B. Khristin Keleher, PA-C 09/29/2019 2:34 PM

## 2019-09-29 ENCOUNTER — Encounter: Payer: Self-pay | Admitting: Obstetrics and Gynecology

## 2019-09-29 ENCOUNTER — Other Ambulatory Visit: Payer: Self-pay

## 2019-09-29 ENCOUNTER — Ambulatory Visit (INDEPENDENT_AMBULATORY_CARE_PROVIDER_SITE_OTHER): Payer: No Typology Code available for payment source | Admitting: Obstetrics and Gynecology

## 2019-09-29 VITALS — BP 100/80 | Ht 67.0 in | Wt 292.0 lb

## 2019-09-29 DIAGNOSIS — Z1231 Encounter for screening mammogram for malignant neoplasm of breast: Secondary | ICD-10-CM

## 2019-09-29 DIAGNOSIS — Z01419 Encounter for gynecological examination (general) (routine) without abnormal findings: Secondary | ICD-10-CM | POA: Diagnosis not present

## 2019-09-29 NOTE — Patient Instructions (Signed)
I value your feedback and entrusting us with your care. If you get a [facility] patient survey, I would appreciate you taking the time to let us know about your experience today. Thank you!  As of September 17, 2019, your lab results will be released to your MyChart immediately, before I even have a chance to see them. Please give me time to review them and contact you if there are any abnormalities. Thank you for your patience.  

## 2020-06-03 ENCOUNTER — Other Ambulatory Visit: Payer: Self-pay | Admitting: Family Medicine

## 2020-06-03 DIAGNOSIS — Z1231 Encounter for screening mammogram for malignant neoplasm of breast: Secondary | ICD-10-CM

## 2020-07-04 ENCOUNTER — Ambulatory Visit
Admission: RE | Admit: 2020-07-04 | Discharge: 2020-07-04 | Disposition: A | Discharge status: Home / Self Care | Payer: Medicare HMO | Source: Ambulatory Visit | Attending: Family Medicine | Admitting: Family Medicine

## 2020-07-04 ENCOUNTER — Other Ambulatory Visit: Payer: Self-pay

## 2020-07-04 DIAGNOSIS — Z1231 Encounter for screening mammogram for malignant neoplasm of breast: Secondary | ICD-10-CM

## 2020-09-28 NOTE — Progress Notes (Signed)
PCP: Frazier Richards, MD   Chief Complaint  Patient presents with   Gynecologic Exam    No concerns    HPI:      [name] is a 64 y.o. G0P0000 who LMP was No LMP recorded. Patient has had a hysterectomy., presents today for her annual examination.  Her menses are absent due to [facility] 2008 due to uterine cancer.  She does not have intermenstrual bleeding.  She does not have vasomotor sx.   Sex activity: single partner, contraception - status post hysterectomy. She does have vaginal dryness, uses lubricants with relief.  Last Pap: July 23, 2016  Results were: no abnormalities /neg HPV DNA. No longer indicated Hx of STDs: none  Last mammogram: 07/04/20  Results were: normal--routine follow-up in 12 months There is a FH of breast cancer in her sister and mat cousin, genetic testing not indicated for pt. There is no FH of ovarian cancer. The patient does do self-breast exams. 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 Bary Castilla, managed by PCP. Stopped 12/21.   Colonoscopy: colonoscopy 2013 without abnormalities. Repeat due after 10 years.   Tobacco use: The patient denies current or previous tobacco use. Alcohol use: none  No drug use Exercise: moderately active  She does get adequate calcium and Vitamin D in her diet.  Labs with PCP.  Past Medical History:  Diagnosis Date   Anemia    H/O   Cancer ([place]) 2008   uterine ca   Colon polyp 2010   Diabetes mellitus without complication ([place]) 1610   Hyperlipidemia    Hypertension 2006   Kidney stone    PONV (postoperative nausea and vomiting)     Past Surgical History:  Procedure Laterality Date   ABDOMINAL HYSTERECTOMY  2008   [facility]   BREAST BIOPSY Left 06/15/14   [place] bx /clip-neg/cyst   BREAST BIOPSY Right 07-05-15   8:30 location/atypical ductal hyperplasia   BREAST BIOPSY Right 07-05-15   fibroadenoma   BREAST BIOPSY Right 07/21/2015    Procedure: BREAST BIOPSY WITH NEEDLE LOCALIZATION;  Surgeon: Robert Bellow, MD;  Location: [facility] ORS;  Service: General;  Laterality: Right;   COLONOSCOPY  2010, 2013   DILATION AND CURETTAGE OF UTERUS     FOOT SURGERY Bilateral    MOUTH SURGERY     DENTAL IMPLANTS    Family History  Problem Relation Age of Onset   Diabetes Mother        DM Type 2   Heart disease Mother    Hypertension Father    Congestive Heart Failure Father    Heart disease Father    Diabetes Maternal Grandmother    Breast cancer Sister 36       Just diagnosed 2019   Breast cancer Cousin 45       mat cousin    Social History   Socioeconomic History   Marital status: Married    Spouse name: Not on file   Number of children: Not on file   Years of education: Not on file   Highest education level: Not on file  Occupational History   Not on file  Tobacco Use   Smoking status: Never Smoker   Smokeless tobacco: Never Used  Vaping Use   Vaping Use: Never used  Substance and Sexual Activity   Alcohol use: No    Alcohol/week: 0.0 standard drinks   Drug use: No  Sexual activity: Yes    Birth control/protection: Post-menopausal, Surgical    Comment: Hysterectomy  Other Topics Concern   Not on file  Social History Narrative   Not on file   Social Determinants of Health   Financial Resource Strain: Not on file  Food Insecurity: Not on file  Transportation Needs: Not on file  Physical Activity: Not on file  Stress: Not on file  Social Connections: Not on file  Intimate Partner Violence: Not on file    Current Meds  Medication Sig   aspirin 81 MG tablet Take 81 mg by mouth daily.   atenolol (TENORMIN) 50 MG tablet Take 50 mg by mouth every morning.    atorvastatin (LIPITOR) 80 MG tablet Take 80 mg by mouth daily at 6 PM.    Cholecalciferol (VITAMIN D3) 1000 UNITS CAPS Take by mouth 2 (two) times daily.   enalapril (VASOTEC) 20 MG tablet Take 20 mg by mouth  every morning.    glipiZIDE (GLUCOTROL XL) 2.5 MG 24 hr tablet Take 2.5 mg by mouth daily with breakfast.   hydrochlorothiazide (HYDRODIURIL) 12.5 MG tablet Take 12.5 mg by mouth daily.   metFORMIN (GLUCOPHAGE-XR) 500 MG 24 hr tablet Take 500 mg by mouth 2 (two) times daily.      ROS:  Review of Systems  Constitutional: Negative for fatigue, fever and unexpected weight change.  Respiratory: Negative for cough, shortness of breath and wheezing.   Cardiovascular: Negative for chest pain, palpitations and leg swelling.  Gastrointestinal: Negative for blood in stool, constipation, diarrhea, nausea and vomiting.  Endocrine: Negative for cold intolerance, heat intolerance and polyuria.  Genitourinary: Negative for dyspareunia, dysuria, flank pain, frequency, genital sores, hematuria, menstrual problem, pelvic pain, urgency, vaginal bleeding, vaginal discharge and vaginal pain.  Musculoskeletal: Negative for back pain, joint swelling and myalgias.  Skin: Negative for rash.  Neurological: Negative for dizziness, syncope, light-headedness, numbness and headaches.  Hematological: Negative for adenopathy.  Psychiatric/Behavioral: Negative for agitation, confusion, sleep disturbance and suicidal ideas. The patient is not nervous/anxious.      Objective: BP 110/60    Ht 5\' 6"  (1.676 m)    Wt 288 lb (130.6 kg)    BMI 46.48 kg/m    Physical Exam Constitutional:      Appearance: She is well-developed.  Genitourinary:     Vulva and vagina normal.     Genitourinary Comments: UTERUS/CX SURG REM     Right Labia: No tenderness, lesions or skin changes.    Left Labia: No tenderness, lesions or skin changes.    Vaginal cuff intact.    No vaginal discharge, erythema or tenderness.      Right Adnexa: absent.    Right Adnexa: not tender and no mass present.    Left Adnexa: absent.    Left Adnexa: not tender and no mass present.    Cervix is absent.     Uterus is absent.  Breasts:     Right:  No mass, nipple discharge, skin change or tenderness.     Left: No mass, nipple discharge, skin change or tenderness.    Neck:     Thyroid: No thyromegaly.  Cardiovascular:     Rate and Rhythm: Normal rate and regular rhythm.     Heart sounds: Normal heart sounds. No murmur heard.   Pulmonary:     Effort: Pulmonary effort is normal.     Breath sounds: Normal breath sounds.  Abdominal:     Palpations: Abdomen is soft.  Tenderness: There is no abdominal tenderness. There is no guarding.  Musculoskeletal:        General: Normal range of motion.     Cervical back: Normal range of motion.  Neurological:     General: No focal deficit present.     Mental Status: She is alert and oriented to person, place, and time.     Cranial Nerves: No cranial nerve deficit.  Skin:    General: Skin is warm and dry.  Psychiatric:        Mood and Affect: Mood normal.        Behavior: Behavior normal.        Thought Content: Thought content normal.        Judgment: Judgment normal.  Vitals reviewed.     Assessment/Plan:  Encounter for annual routine gynecological examination  Encounter for screening mammogram for malignant neoplasm of breast; pt current on mammo  History of tamoxifen therapy--completed 12/21.           GYN counsel mammography screening, adequate intake of calcium and vitamin D, diet and exercise    F/U  Return in about 2 years (around 09/29/2022).  Kensey Luepke B. Tomeko Scoville, PA-C 09/29/2020 8:44 AM

## 2020-09-29 ENCOUNTER — Ambulatory Visit (INDEPENDENT_AMBULATORY_CARE_PROVIDER_SITE_OTHER): Payer: Medicare HMO | Admitting: Obstetrics and Gynecology

## 2020-09-29 ENCOUNTER — Encounter: Payer: Self-pay | Admitting: Obstetrics and Gynecology

## 2020-09-29 ENCOUNTER — Other Ambulatory Visit: Payer: Self-pay

## 2020-09-29 VITALS — BP 110/60 | Ht 66.0 in | Wt 288.0 lb

## 2020-09-29 DIAGNOSIS — Z01419 Encounter for gynecological examination (general) (routine) without abnormal findings: Secondary | ICD-10-CM | POA: Diagnosis not present

## 2020-09-29 DIAGNOSIS — Z1231 Encounter for screening mammogram for malignant neoplasm of breast: Secondary | ICD-10-CM | POA: Diagnosis not present

## 2020-09-29 DIAGNOSIS — Z9229 Personal history of other drug therapy: Secondary | ICD-10-CM | POA: Insufficient documentation

## 2020-09-29 NOTE — Patient Instructions (Signed)
I value your feedback and entrusting us with your care. If you get a [facility] patient survey, I would appreciate you taking the time to let us know about your experience today. Thank you!  As of September 17, 2019, your lab results will be released to your MyChart immediately, before I even have a chance to see them. Please give me time to review them and contact you if there are any abnormalities. Thank you for your patience.  

## 2021-05-25 ENCOUNTER — Other Ambulatory Visit: Payer: Self-pay | Admitting: Family Medicine

## 2021-05-25 DIAGNOSIS — Z1231 Encounter for screening mammogram for malignant neoplasm of breast: Secondary | ICD-10-CM

## 2021-07-05 ENCOUNTER — Ambulatory Visit
Admission: RE | Admit: 2021-07-05 | Discharge: 2021-07-05 | Disposition: A | Discharge status: Home / Self Care | Payer: Medicare HMO | Source: Ambulatory Visit | Attending: Family Medicine | Admitting: Family Medicine

## 2021-07-05 ENCOUNTER — Other Ambulatory Visit: Payer: Self-pay

## 2021-07-05 DIAGNOSIS — Z1231 Encounter for screening mammogram for malignant neoplasm of breast: Secondary | ICD-10-CM | POA: Insufficient documentation

## 2022-05-22 ENCOUNTER — Other Ambulatory Visit: Payer: Self-pay | Admitting: Family Medicine

## 2022-05-22 DIAGNOSIS — Z1231 Encounter for screening mammogram for malignant neoplasm of breast: Secondary | ICD-10-CM

## 2022-07-09 ENCOUNTER — Ambulatory Visit
Admission: RE | Admit: 2022-07-09 | Discharge: 2022-07-09 | Disposition: A | Discharge status: Home / Self Care | Payer: Medicare HMO | Source: Ambulatory Visit | Attending: Family Medicine | Admitting: Family Medicine

## 2022-07-09 DIAGNOSIS — Z1231 Encounter for screening mammogram for malignant neoplasm of breast: Secondary | ICD-10-CM | POA: Insufficient documentation

## 2023-05-29 ENCOUNTER — Other Ambulatory Visit: Payer: Self-pay | Admitting: Family Medicine

## 2023-05-29 DIAGNOSIS — Z1231 Encounter for screening mammogram for malignant neoplasm of breast: Secondary | ICD-10-CM

## 2023-07-30 ENCOUNTER — Ambulatory Visit
Admission: RE | Admit: 2023-07-30 | Discharge: 2023-07-30 | Disposition: A | Discharge status: Home / Self Care | Payer: Medicare HMO | Source: Ambulatory Visit | Attending: Family Medicine | Admitting: Family Medicine

## 2023-07-30 DIAGNOSIS — Z1231 Encounter for screening mammogram for malignant neoplasm of breast: Secondary | ICD-10-CM | POA: Diagnosis present

## 2024-07-01 ENCOUNTER — Other Ambulatory Visit: Payer: Self-pay | Admitting: Family Medicine

## 2024-07-01 DIAGNOSIS — Z1231 Encounter for screening mammogram for malignant neoplasm of breast: Secondary | ICD-10-CM

## 2024-07-13 LAB — COLOGUARD: COLOGUARD: NEGATIVE

## 2024-08-06 ENCOUNTER — Ambulatory Visit
Admission: RE | Admit: 2024-08-06 | Discharge: 2024-08-06 | Disposition: A | Discharge status: Home / Self Care | Source: Ambulatory Visit | Attending: Family Medicine | Admitting: Family Medicine

## 2024-08-06 DIAGNOSIS — Z1231 Encounter for screening mammogram for malignant neoplasm of breast: Secondary | ICD-10-CM | POA: Diagnosis present
